# Patient Record
Sex: Male | Born: 1965
Health system: Southern US, Academic
[De-identification: ages and names within clinical notes are randomized; demographics above are authoritative.]

## PROBLEM LIST (undated history)

## (undated) ENCOUNTER — Encounter: Attending: Adult Health | Primary: Adult Health

## (undated) ENCOUNTER — Ambulatory Visit: Payer: PRIVATE HEALTH INSURANCE

## (undated) ENCOUNTER — Encounter

## (undated) ENCOUNTER — Ambulatory Visit: Payer: Medicare (Managed Care) | Attending: Adult Health | Primary: Adult Health

## (undated) ENCOUNTER — Telehealth

## (undated) ENCOUNTER — Telehealth: Attending: Urology | Primary: Urology

## (undated) ENCOUNTER — Ambulatory Visit

## (undated) ENCOUNTER — Encounter: Payer: PRIVATE HEALTH INSURANCE | Attending: Gastroenterology | Primary: Gastroenterology

## (undated) ENCOUNTER — Encounter: Attending: Family Medicine | Primary: Family Medicine

## (undated) ENCOUNTER — Encounter: Attending: Physician Assistant | Primary: Physician Assistant

## (undated) ENCOUNTER — Encounter
Payer: PRIVATE HEALTH INSURANCE | Attending: Student in an Organized Health Care Education/Training Program | Primary: Student in an Organized Health Care Education/Training Program

## (undated) ENCOUNTER — Ambulatory Visit: Payer: PRIVATE HEALTH INSURANCE | Attending: Adult Health | Primary: Adult Health

## (undated) ENCOUNTER — Inpatient Hospital Stay: Payer: Medicare (Managed Care)

## (undated) ENCOUNTER — Encounter: Payer: PRIVATE HEALTH INSURANCE | Attending: Family Medicine | Primary: Family Medicine

## (undated) ENCOUNTER — Encounter: Payer: PRIVATE HEALTH INSURANCE | Attending: Adult Health | Primary: Adult Health

## (undated) ENCOUNTER — Telehealth: Attending: Pulmonary Disease | Primary: Pulmonary Disease

## (undated) ENCOUNTER — Encounter: Payer: PRIVATE HEALTH INSURANCE | Attending: Urology | Primary: Urology

## (undated) ENCOUNTER — Ambulatory Visit: Payer: MEDICARE

## (undated) ENCOUNTER — Encounter: Payer: PRIVATE HEALTH INSURANCE | Attending: Mental Health | Primary: Mental Health

## (undated) ENCOUNTER — Encounter: Attending: Pulmonary Disease | Primary: Pulmonary Disease

## (undated) ENCOUNTER — Ambulatory Visit: Payer: PRIVATE HEALTH INSURANCE | Attending: Urology | Primary: Urology

## (undated) ENCOUNTER — Encounter: Attending: Internal Medicine | Primary: Internal Medicine

## (undated) ENCOUNTER — Ambulatory Visit: Payer: Medicare (Managed Care) | Attending: Neurology | Primary: Neurology

## (undated) ENCOUNTER — Ambulatory Visit: Payer: PRIVATE HEALTH INSURANCE | Attending: Family Medicine | Primary: Family Medicine

## (undated) DIAGNOSIS — J449 Chronic obstructive pulmonary disease, unspecified: Secondary | ICD-10-CM

## (undated) DIAGNOSIS — F988 Other specified behavioral and emotional disorders with onset usually occurring in childhood and adolescence: Secondary | ICD-10-CM

## (undated) DIAGNOSIS — F1120 Opioid dependence, uncomplicated: Secondary | ICD-10-CM

## (undated) DIAGNOSIS — G43909 Migraine, unspecified, not intractable, without status migrainosus: Secondary | ICD-10-CM

## (undated) HISTORY — PX: CORONARY ANGIOPLASTY WITH STENT PLACEMENT: SHX49

## (undated) HISTORY — PX: NO PAST SURGERIES: SHX2092

---
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
## 2010-01-23 ENCOUNTER — Emergency Department: Payer: Self-pay | Admitting: Unknown Physician Specialty

## 2010-06-19 ENCOUNTER — Emergency Department: Payer: Self-pay | Admitting: Unknown Physician Specialty

## 2013-01-11 ENCOUNTER — Encounter: Payer: Self-pay | Admitting: Physician Assistant

## 2013-02-06 ENCOUNTER — Encounter: Payer: Self-pay | Admitting: Physician Assistant

## 2013-05-27 ENCOUNTER — Ambulatory Visit: Payer: Self-pay

## 2013-06-27 ENCOUNTER — Ambulatory Visit: Payer: Self-pay

## 2013-08-29 ENCOUNTER — Emergency Department: Payer: Self-pay | Admitting: Emergency Medicine

## 2013-08-29 LAB — BASIC METABOLIC PANEL
Anion Gap: 5 — ABNORMAL LOW (ref 7–16)
BUN: 10 mg/dL (ref 7–18)
CALCIUM: 8.6 mg/dL (ref 8.5–10.1)
CREATININE: 0.91 mg/dL (ref 0.60–1.30)
Chloride: 106 mmol/L (ref 98–107)
Co2: 27 mmol/L (ref 21–32)
EGFR (African American): >60
EGFR (Non-African Amer.): >60
Glucose: 103 mg/dL — ABNORMAL HIGH (ref 65–99)
Osmolality: 275 (ref 275–301)
POTASSIUM: 4 mmol/L (ref 3.5–5.1)
Sodium: 138 mmol/L (ref 136–145)

## 2013-08-29 LAB — URINALYSIS, COMPLETE
BILIRUBIN, UR: NEGATIVE
Bacteria: NONE SEEN
Glucose,UR: NEGATIVE mg/dL (ref 0–75)
KETONE: NEGATIVE
Leukocyte Esterase: NEGATIVE
NITRITE: NEGATIVE
PROTEIN: NEGATIVE
Ph: 5 (ref 4.5–8.0)
Specific Gravity: 1.021 (ref 1.003–1.030)
Squamous Epithelial: <1

## 2013-08-29 LAB — CBC
HCT: 43.2 % (ref 40.0–52.0)
HGB: 14.3 g/dL (ref 13.0–18.0)
MCH: 29.3 pg (ref 26.0–34.0)
MCHC: 33.2 g/dL (ref 32.0–36.0)
MCV: 88 fL (ref 80–100)
PLATELETS: 267 10*3/uL (ref 150–440)
RBC: 4.89 10*6/uL (ref 4.40–5.90)
RDW: 13.8 % (ref 11.5–14.5)
WBC: 13.2 10*3/uL — AB (ref 3.8–10.6)

## 2013-08-29 LAB — GC/CHLAMYDIA PROBE AMP

## 2013-09-28 ENCOUNTER — Ambulatory Visit: Payer: Self-pay | Admitting: Emergency Medicine

## 2013-11-21 ENCOUNTER — Ambulatory Visit: Payer: Self-pay | Admitting: Family Medicine

## 2013-11-21 LAB — CBC WITH DIFFERENTIAL/PLATELET
BASOS PCT: 0.9 %
Basophil #: 0.1 10*3/uL (ref 0.0–0.1)
Eosinophil #: 0.6 10*3/uL (ref 0.0–0.7)
Eosinophil %: 5 %
HCT: 42.9 % (ref 40.0–52.0)
HGB: 14.2 g/dL (ref 13.0–18.0)
LYMPHS ABS: 4 10*3/uL — AB (ref 1.0–3.6)
Lymphocyte %: 33.4 %
MCH: 29.1 pg (ref 26.0–34.0)
MCHC: 33 g/dL (ref 32.0–36.0)
MCV: 88 fL (ref 80–100)
Monocyte #: 0.8 x10 3/mm (ref 0.2–1.0)
Monocyte %: 6.9 %
NEUTROS ABS: 6.5 10*3/uL (ref 1.4–6.5)
Neutrophil %: 53.8 %
Platelet: 249 10*3/uL (ref 150–440)
RBC: 4.87 10*6/uL (ref 4.40–5.90)
RDW: 13.1 % (ref 11.5–14.5)
WBC: 12 10*3/uL — ABNORMAL HIGH (ref 3.8–10.6)

## 2013-11-21 LAB — COMPREHENSIVE METABOLIC PANEL
ALK PHOS: 81 U/L
ALT: 29 U/L (ref 12–78)
ANION GAP: 5 — AB (ref 7–16)
AST: 17 U/L (ref 15–37)
Albumin: 4.1 g/dL (ref 3.4–5.0)
BUN: 16 mg/dL (ref 7–18)
Bilirubin,Total: 0.3 mg/dL (ref 0.2–1.0)
Calcium, Total: 8.8 mg/dL (ref 8.5–10.1)
Chloride: 100 mmol/L (ref 98–107)
Co2: 32 mmol/L (ref 21–32)
Creatinine: 0.93 mg/dL (ref 0.60–1.30)
EGFR (African American): >60
GLUCOSE: 94 mg/dL (ref 65–99)
Osmolality: 275 (ref 275–301)
Potassium: 3.6 mmol/L (ref 3.5–5.1)
Sodium: 137 mmol/L (ref 136–145)
Total Protein: 7.6 g/dL (ref 6.4–8.2)

## 2013-11-21 LAB — URINALYSIS, COMPLETE
Bacteria: NEGATIVE
Bilirubin,UR: NEGATIVE
Glucose,UR: NEGATIVE mg/dL (ref 0–75)
KETONE: NEGATIVE
Leukocyte Esterase: NEGATIVE
Nitrite: NEGATIVE
PH: 7.5 (ref 4.5–8.0)
PROTEIN: NEGATIVE
SPECIFIC GRAVITY: 1.005 (ref 1.003–1.030)
SQUAMOUS EPITHELIAL: NONE SEEN
WBC UR: NONE SEEN /HPF (ref 0–5)

## 2013-11-28 ENCOUNTER — Ambulatory Visit: Payer: Self-pay | Admitting: Internal Medicine

## 2013-11-28 LAB — COMPREHENSIVE METABOLIC PANEL
ALK PHOS: 74 U/L
ALT: 33 U/L (ref 12–78)
Albumin: 3.9 g/dL (ref 3.4–5.0)
Anion Gap: 9 (ref 7–16)
BUN: 10 mg/dL (ref 7–18)
Bilirubin,Total: 0.3 mg/dL (ref 0.2–1.0)
Calcium, Total: 9.1 mg/dL (ref 8.5–10.1)
Chloride: 102 mmol/L (ref 98–107)
Co2: 28 mmol/L (ref 21–32)
Creatinine: 0.84 mg/dL (ref 0.60–1.30)
EGFR (African American): >60
EGFR (Non-African Amer.): >60
Glucose: 115 mg/dL — ABNORMAL HIGH (ref 65–99)
OSMOLALITY: 278 (ref 275–301)
Potassium: 4.5 mmol/L (ref 3.5–5.1)
SGOT(AST): 18 U/L (ref 15–37)
Sodium: 139 mmol/L (ref 136–145)
TOTAL PROTEIN: 7.2 g/dL (ref 6.4–8.2)

## 2013-11-28 LAB — URINALYSIS, COMPLETE
BILIRUBIN, UR: NEGATIVE
Bacteria: NEGATIVE
Glucose,UR: NEGATIVE mg/dL (ref 0–75)
KETONE: NEGATIVE
LEUKOCYTE ESTERASE: NEGATIVE
NITRITE: NEGATIVE
PH: 6 (ref 4.5–8.0)
Protein: NEGATIVE
SPECIFIC GRAVITY: 1.025 (ref 1.003–1.030)
Squamous Epithelial: NONE SEEN

## 2013-11-28 LAB — CBC WITH DIFFERENTIAL/PLATELET
Basophil #: 0.1 10*3/uL (ref 0.0–0.1)
Basophil %: 0.6 %
EOS ABS: 0.3 10*3/uL (ref 0.0–0.7)
Eosinophil %: 1.7 %
HCT: 43.7 % (ref 40.0–52.0)
HGB: 14.2 g/dL (ref 13.0–18.0)
LYMPHS PCT: 15.8 %
Lymphocyte #: 2.4 10*3/uL (ref 1.0–3.6)
MCH: 28.7 pg (ref 26.0–34.0)
MCHC: 32.4 g/dL (ref 32.0–36.0)
MCV: 89 fL (ref 80–100)
Monocyte #: 0.7 x10 3/mm (ref 0.2–1.0)
Monocyte %: 4.8 %
NEUTROS ABS: 11.5 10*3/uL — AB (ref 1.4–6.5)
NEUTROS PCT: 77.1 %
PLATELETS: 243 10*3/uL (ref 150–440)
RBC: 4.94 10*6/uL (ref 4.40–5.90)
RDW: 13.4 % (ref 11.5–14.5)
WBC: 15 10*3/uL — ABNORMAL HIGH (ref 3.8–10.6)

## 2013-11-28 LAB — DRUG SCREEN, URINE
Amphetamines, Ur Screen: NEGATIVE (ref ?–1000)
Barbiturates, Ur Screen: NEGATIVE (ref ?–200)
Benzodiazepine, Ur Scrn: NEGATIVE (ref ?–200)
CANNABINOID 50 NG, UR ~~LOC~~: POSITIVE (ref ?–50)
COCAINE METABOLITE, UR ~~LOC~~: NEGATIVE (ref ?–300)
MDMA (Ecstasy)Ur Screen: NEGATIVE (ref ?–500)
Methadone, Ur Screen: NEGATIVE (ref ?–300)
Opiate, Ur Screen: NEGATIVE (ref ?–300)
Phencyclidine (PCP) Ur S: NEGATIVE (ref ?–25)
Tricyclic, Ur Screen: NEGATIVE (ref ?–1000)

## 2013-11-28 LAB — LIPASE, BLOOD: Lipase: 94 U/L (ref 73–393)

## 2013-11-28 LAB — AMYLASE: AMYLASE: 25 U/L (ref 25–115)

## 2013-11-29 LAB — URINE CULTURE

## 2014-01-04 ENCOUNTER — Ambulatory Visit: Payer: Self-pay

## 2014-01-04 LAB — COMPREHENSIVE METABOLIC PANEL
ALT: 29 U/L
AST: 21 U/L (ref 15–37)
Albumin: 4 g/dL (ref 3.4–5.0)
Alkaline Phosphatase: 72 U/L
Anion Gap: 5 — ABNORMAL LOW (ref 7–16)
BILIRUBIN TOTAL: 0.5 mg/dL (ref 0.2–1.0)
BUN: 19 mg/dL — ABNORMAL HIGH (ref 7–18)
CHLORIDE: 102 mmol/L (ref 98–107)
CO2: 29 mmol/L (ref 21–32)
CREATININE: 1.14 mg/dL (ref 0.60–1.30)
Calcium, Total: 8.9 mg/dL (ref 8.5–10.1)
EGFR (African American): >60
EGFR (Non-African Amer.): >60
GLUCOSE: 102 mg/dL — AB (ref 65–99)
Osmolality: 274 (ref 275–301)
Potassium: 4.6 mmol/L (ref 3.5–5.1)
Sodium: 136 mmol/L (ref 136–145)
TOTAL PROTEIN: 7.3 g/dL (ref 6.4–8.2)

## 2014-01-04 LAB — URINALYSIS, COMPLETE
Bacteria: NEGATIVE
Bilirubin,UR: NEGATIVE
Glucose,UR: NEGATIVE mg/dL (ref 0–75)
Ketone: NEGATIVE
Leukocyte Esterase: NEGATIVE
Nitrite: NEGATIVE
Ph: 6 (ref 4.5–8.0)
Protein: NEGATIVE
Specific Gravity: 1.02 (ref 1.003–1.030)
Squamous Epithelial: NONE SEEN

## 2014-01-04 LAB — CBC WITH DIFFERENTIAL/PLATELET
Basophil #: 0.1 10*3/uL (ref 0.0–0.1)
Basophil %: 0.8 %
Eosinophil #: 0.4 10*3/uL (ref 0.0–0.7)
Eosinophil %: 2.9 %
HCT: 43.5 % (ref 40.0–52.0)
HGB: 14.4 g/dL (ref 13.0–18.0)
LYMPHS ABS: 3.5 10*3/uL (ref 1.0–3.6)
Lymphocyte %: 28.4 %
MCH: 28.9 pg (ref 26.0–34.0)
MCHC: 33 g/dL (ref 32.0–36.0)
MCV: 88 fL (ref 80–100)
MONO ABS: 0.6 x10 3/mm (ref 0.2–1.0)
Monocyte %: 4.7 %
Neutrophil #: 7.9 10*3/uL — ABNORMAL HIGH (ref 1.4–6.5)
Neutrophil %: 63.2 %
Platelet: 270 10*3/uL (ref 150–440)
RBC: 4.96 10*6/uL (ref 4.40–5.90)
RDW: 13.1 % (ref 11.5–14.5)
WBC: 12.5 10*3/uL — ABNORMAL HIGH (ref 3.8–10.6)

## 2014-01-04 LAB — SEDIMENTATION RATE: ERYTHROCYTE SED RATE: 5 mm/h (ref 0–15)

## 2014-01-04 LAB — LIPASE, BLOOD: LIPASE: 186 U/L (ref 73–393)

## 2014-01-04 LAB — AMYLASE: Amylase: 37 U/L (ref 25–115)

## 2014-01-05 ENCOUNTER — Ambulatory Visit: Payer: Self-pay

## 2014-01-05 LAB — CLOSTRIDIUM DIFFICILE(ARMC)

## 2014-01-08 LAB — STOOL CULTURE

## 2014-01-11 ENCOUNTER — Ambulatory Visit: Payer: Self-pay | Admitting: Family Medicine

## 2014-05-04 ENCOUNTER — Ambulatory Visit: Payer: Self-pay | Admitting: Physician Assistant

## 2014-06-11 LAB — BASIC METABOLIC PANEL
Creatinine: 1 mg/dL (ref <=1.3)
Glucose: 82 mg/dL

## 2014-10-02 ENCOUNTER — Ambulatory Visit
Admit: 2014-10-02 | Disposition: A | Discharge status: Home / Self Care | Payer: Self-pay | Attending: Physician Assistant | Admitting: Physician Assistant

## 2014-10-02 ENCOUNTER — Ambulatory Visit
Admit: 2014-10-02 | Disposition: A | Discharge status: Home / Self Care | Payer: Self-pay | Attending: Family Medicine | Admitting: Family Medicine

## 2014-10-02 LAB — CBC WITH DIFFERENTIAL/PLATELET
BASOS PCT: 1.5 %
Basophil #: 0.2 10*3/uL — ABNORMAL HIGH (ref 0.0–0.1)
EOS PCT: 1 %
Eosinophil #: 0.1 10*3/uL (ref 0.0–0.7)
HCT: 40.9 % (ref 40.0–52.0)
HGB: 13.8 g/dL (ref 13.0–18.0)
Lymphocyte #: 3.3 10*3/uL (ref 1.0–3.6)
Lymphocyte %: 26.9 %
MCH: 29.2 pg (ref 26.0–34.0)
MCHC: 33.8 g/dL (ref 32.0–36.0)
MCV: 86 fL (ref 80–100)
MONO ABS: 0.6 x10 3/mm (ref 0.2–1.0)
Monocyte %: 4.6 %
NEUTROS ABS: 8.1 10*3/uL — AB (ref 1.4–6.5)
NEUTROS PCT: 66 %
Platelet: 256 10*3/uL (ref 150–440)
RBC: 4.73 10*6/uL (ref 4.40–5.90)
RDW: 13.2 % (ref 11.5–14.5)
WBC: 12.2 10*3/uL — ABNORMAL HIGH (ref 3.8–10.6)

## 2014-10-02 LAB — COMPREHENSIVE METABOLIC PANEL
ALBUMIN: 4.3 g/dL
ALK PHOS: 70 U/L
Anion Gap: 7 (ref 7–16)
BILIRUBIN TOTAL: 0.4 mg/dL
BUN: 14 mg/dL
CALCIUM: 9.2 mg/dL
CREATININE: 0.8 mg/dL
Chloride: 104 mmol/L
Co2: 26 mmol/L
GLUCOSE: 98 mg/dL
Potassium: 4.6 mmol/L
SGOT(AST): 17 U/L
SGPT (ALT): 15 U/L — ABNORMAL LOW
SODIUM: 137 mmol/L
TOTAL PROTEIN: 7.5 g/dL

## 2014-10-02 LAB — URINALYSIS, COMPLETE
Bacteria: NEGATIVE
Bilirubin,UR: NEGATIVE
Glucose,UR: NEGATIVE
Ketone: NEGATIVE
Leukocyte Esterase: NEGATIVE
NITRITE: NEGATIVE
PH: 7 (ref 5.0–8.0)
PROTEIN: NEGATIVE
Specific Gravity: 1.02 (ref 1.000–1.030)
Squamous Epithelial: NONE SEEN

## 2014-10-02 LAB — LIPASE, BLOOD: Lipase: 61 U/L — ABNORMAL HIGH

## 2014-10-02 LAB — AMYLASE: Amylase: 44 U/L

## 2014-10-04 DIAGNOSIS — F17201 Nicotine dependence, unspecified, in remission: Secondary | ICD-10-CM | POA: Insufficient documentation

## 2014-10-04 DIAGNOSIS — Z87442 Personal history of urinary calculi: Secondary | ICD-10-CM | POA: Insufficient documentation

## 2014-10-04 DIAGNOSIS — J439 Emphysema, unspecified: Secondary | ICD-10-CM | POA: Insufficient documentation

## 2014-10-04 DIAGNOSIS — I10 Essential (primary) hypertension: Secondary | ICD-10-CM | POA: Insufficient documentation

## 2014-10-04 DIAGNOSIS — K529 Noninfective gastroenteritis and colitis, unspecified: Secondary | ICD-10-CM | POA: Insufficient documentation

## 2015-02-15 IMAGING — CT CT STONE STUDY
2 of 4 series · 16 of 46 positions shown, 18 images · non-contrast
Comparison: 08/29/2013

CLINICAL DATA: Bilateral flank pain for 3 days. History of kidney
stone.

EXAM:
CT ABDOMEN AND PELVIS WITHOUT CONTRAST
TECHNIQUE: Multidetector CT imaging of the abdomen and pelvis was performed
following the standard protocol without IV contrast.

[Series 2: stone standard full · axial · 0.76mm/px · z∈[-1099,-664]mm · 13 of 95 slices shown, 15 images]
[im 4/95  soft-tissue]
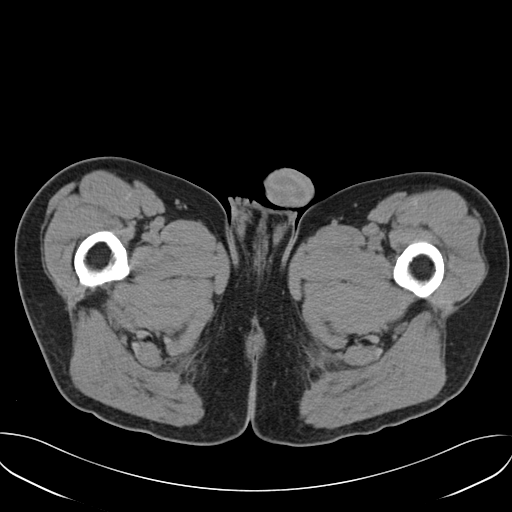
[im 4/95  bone]
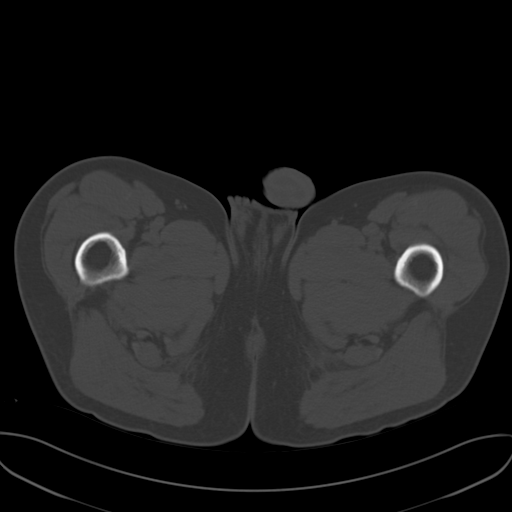
[im 12/95  soft-tissue]
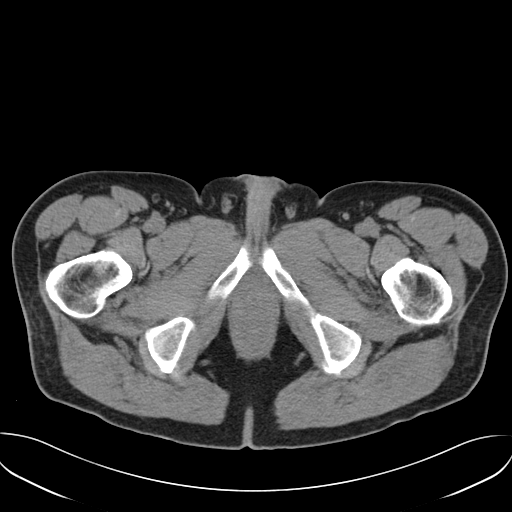
[im 19/95  soft-tissue]
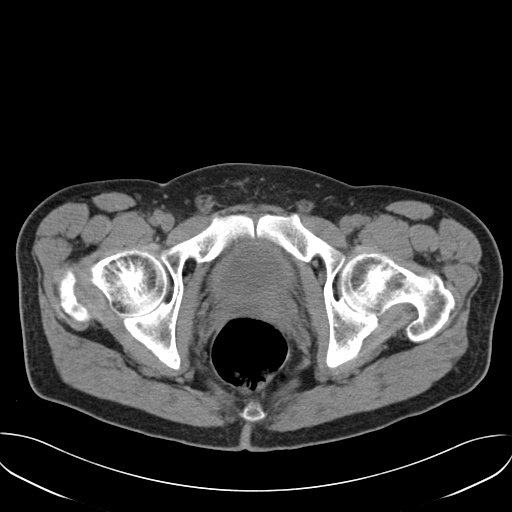
[im 27/95  soft-tissue]
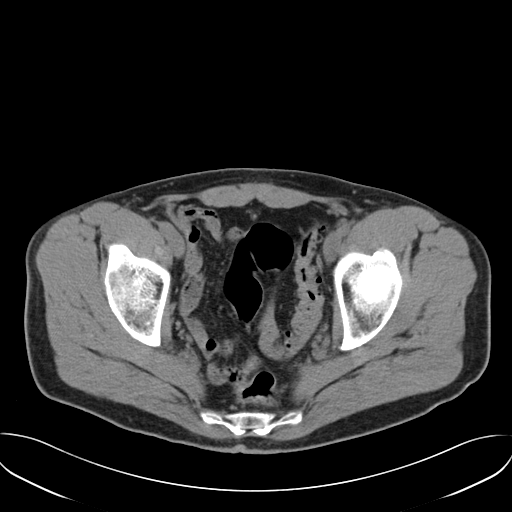
[im 34/95  soft-tissue]
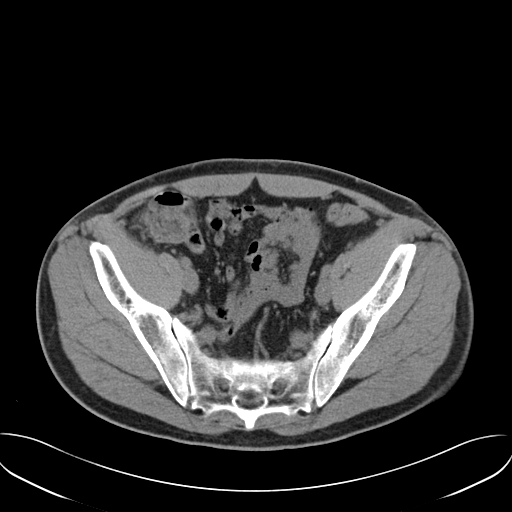
[im 42/95  soft-tissue]
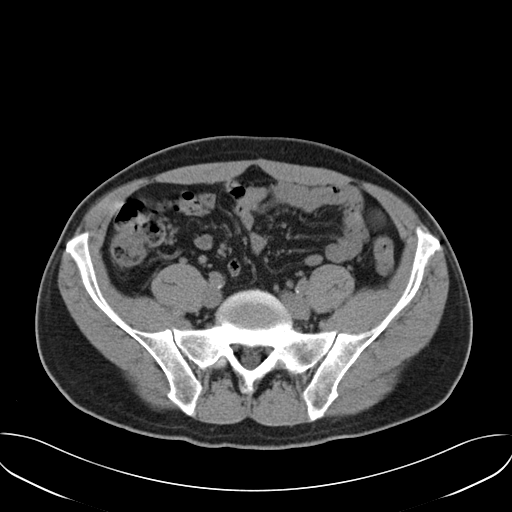
[im 49/95  soft-tissue]
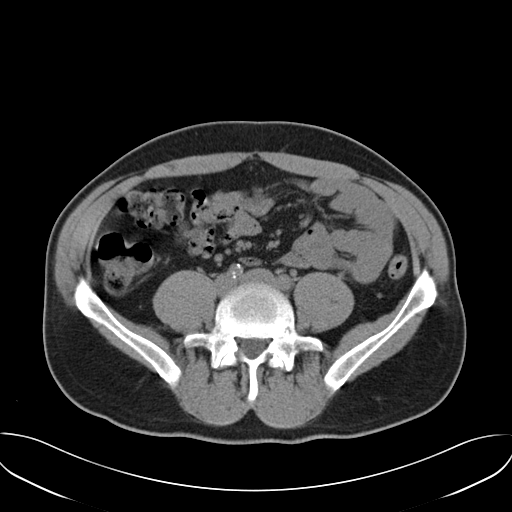
[im 53/95  soft-tissue]
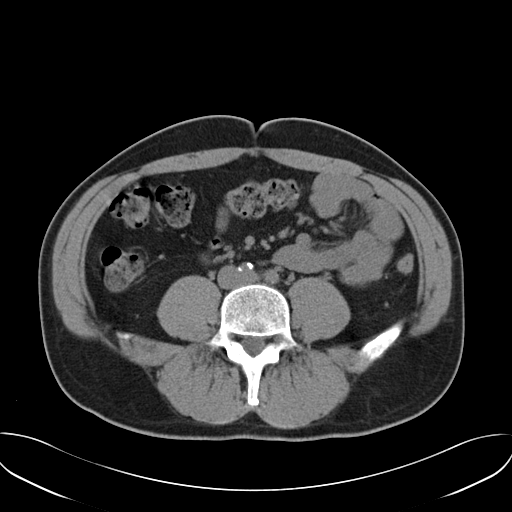
[im 61/95  soft-tissue]
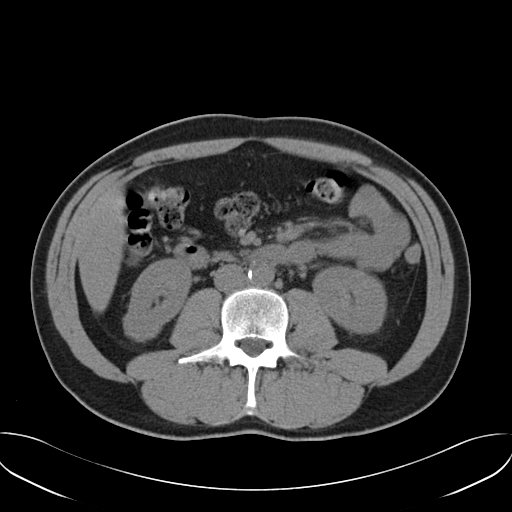
[im 61/95  bone]
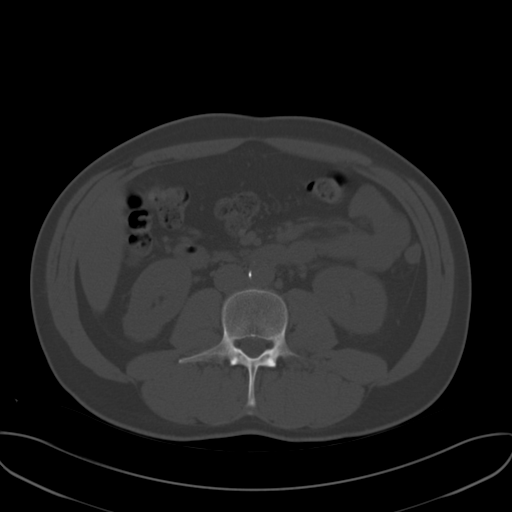
[im 68/95  soft-tissue]
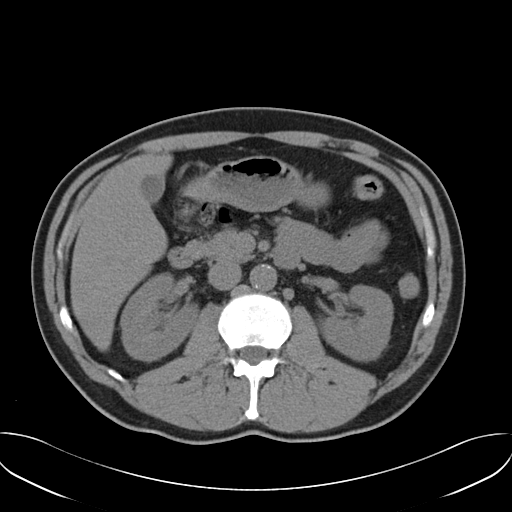
[im 76/95  soft-tissue]
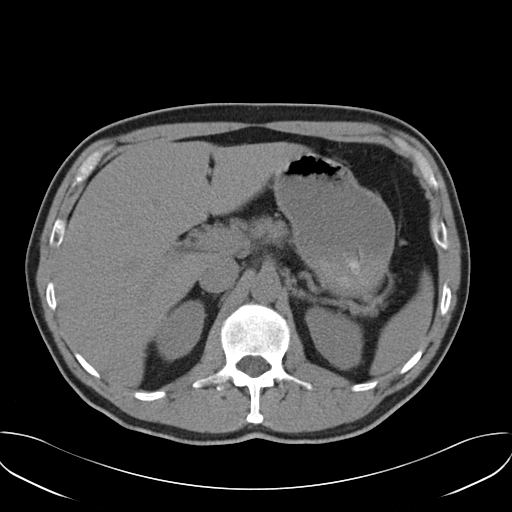
[im 83/95  soft-tissue]
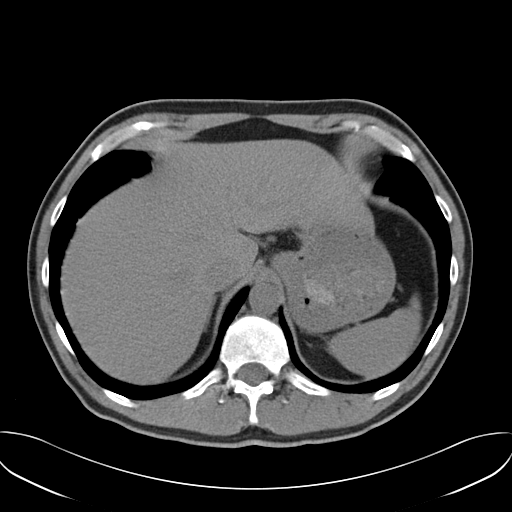
[im 91/95  soft-tissue]
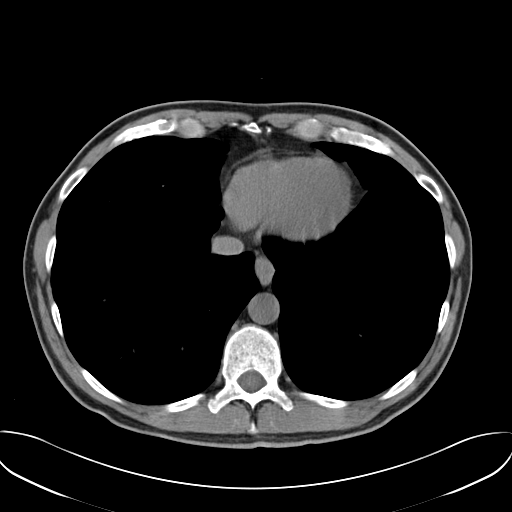

[Series 5: cor stone standard full · coronal · 0.69mm/px · 3 of 127 slices shown]
[im 43/127  soft-tissue]
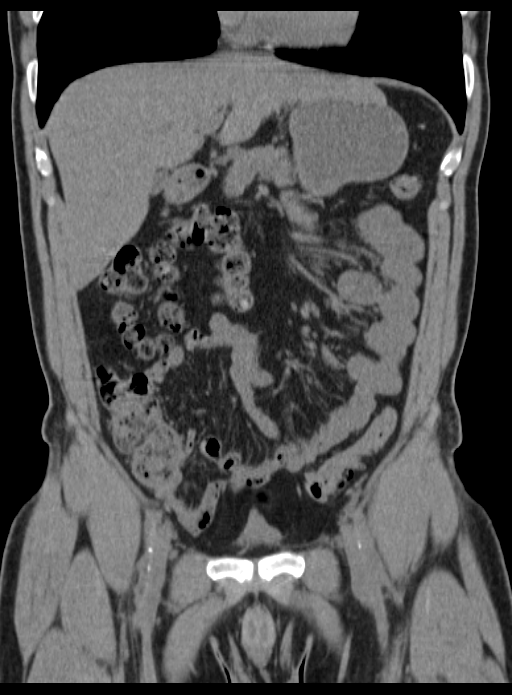
[im 57/127  soft-tissue]
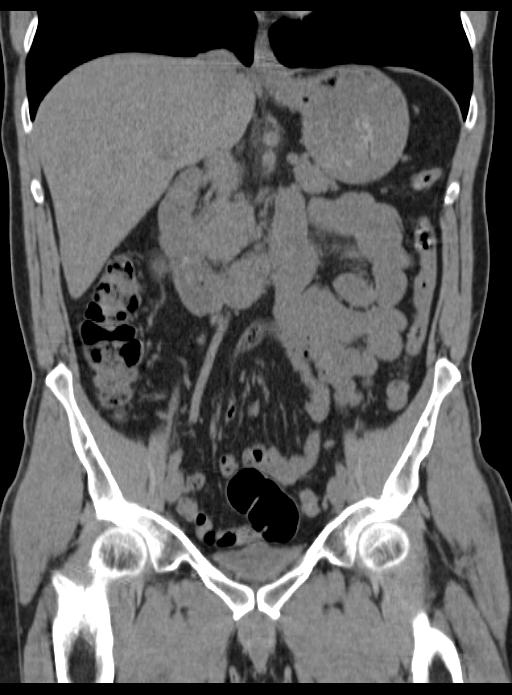
[im 71/127  soft-tissue]
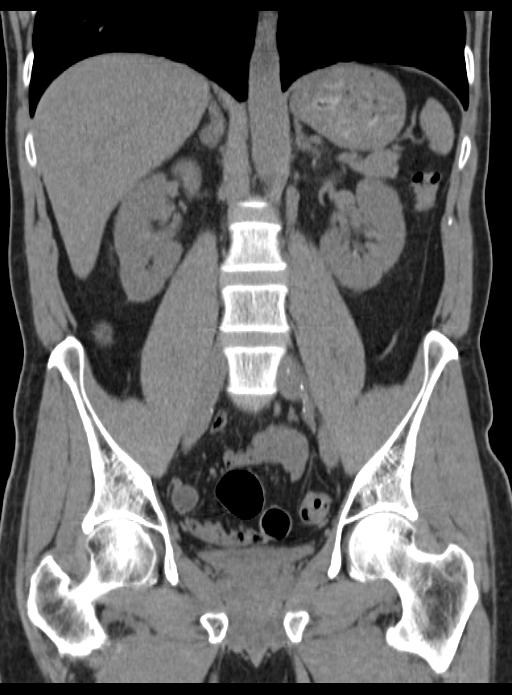

[16 of 46 positions shown; findings below may reference images not displayed]

FINDINGS: Lower Chest: Centrilobular emphysema at the bases. Normal heart size
without pericardial or pleural effusion.

Abdomen/Pelvis: Mild hepatic steatosis. Normal noncontrast
appearance of the spleen, stomach, pancreas, gallbladder, biliary
tract, adrenal glands.

Punctate left renal collecting system calculi, including a tiny
lower pole and a 2 mm upper pole stone. No hydronephrosis. No
right-sided urinary tract calculi. No hydroureter or ureteric
calculi.

Mild but age advanced aortic atherosclerosis. No retroperitoneal or
retrocrural adenopathy.

Scattered colonic diverticula. Normal terminal ileum. Normal small
bowel without abdominal ascites.

No pelvic adenopathy. Normal prostate. No bladder calculi. No
significant free fluid.

Bones/Musculoskeletal:  No acute osseous abnormality.
IMPRESSION: Left nephrolithiasis, without hydronephrosis or other acute process.

Mild hepatic steatosis.

Bibasilar emphysema with age advanced atherosclerosis.

## 2015-03-18 ENCOUNTER — Other Ambulatory Visit: Payer: Self-pay | Admitting: Family Medicine

## 2015-03-18 DIAGNOSIS — K589 Irritable bowel syndrome without diarrhea: Secondary | ICD-10-CM

## 2015-12-10 ENCOUNTER — Ambulatory Visit
Admission: EM | Admit: 2015-12-10 | Discharge: 2015-12-10 | Disposition: A | Discharge status: Other Acute Inpatient Hospital | Payer: BLUE CROSS/BLUE SHIELD | Attending: Family Medicine | Admitting: Family Medicine

## 2015-12-10 DIAGNOSIS — R1013 Epigastric pain: Secondary | ICD-10-CM

## 2015-12-10 HISTORY — DX: Opioid dependence, uncomplicated: F11.20

## 2015-12-10 HISTORY — DX: Migraine, unspecified, not intractable, without status migrainosus: G43.909

## 2015-12-10 HISTORY — DX: Other specified behavioral and emotional disorders with onset usually occurring in childhood and adolescence: F98.8

## 2015-12-10 HISTORY — DX: Chronic obstructive pulmonary disease, unspecified: J44.9

## 2015-12-10 LAB — CBC WITH DIFFERENTIAL/PLATELET
Basophils Absolute: 0.2 10*3/uL — ABNORMAL HIGH (ref 0–0.1)
Basophils Relative: 1 %
Eosinophils Absolute: 0.2 10*3/uL (ref 0–0.7)
HCT: 42.2 % (ref 40.0–52.0)
Hemoglobin: 13.9 g/dL (ref 13.0–18.0)
LYMPHS ABS: 3 10*3/uL (ref 1.0–3.6)
Lymphocytes Relative: 21 %
MCH: 28.8 pg (ref 26.0–34.0)
MCHC: 33 g/dL (ref 32.0–36.0)
MCV: 87.2 fL (ref 80.0–100.0)
Monocytes Absolute: 0.8 10*3/uL (ref 0.2–1.0)
Neutro Abs: 10.6 10*3/uL — ABNORMAL HIGH (ref 1.4–6.5)
Neutrophils Relative %: 71 %
Platelets: 331 10*3/uL (ref 150–440)
RBC: 4.83 MIL/uL (ref 4.40–5.90)
RDW: 13 % (ref 11.5–14.5)
WBC: 14.7 10*3/uL — AB (ref 3.8–10.6)

## 2015-12-10 LAB — COMPREHENSIVE METABOLIC PANEL
ALT: 15 U/L — ABNORMAL LOW (ref 17–63)
ANION GAP: 7 (ref 5–15)
AST: 17 U/L (ref 15–41)
Albumin: 4.2 g/dL (ref 3.5–5.0)
Alkaline Phosphatase: 81 U/L (ref 38–126)
BUN: 17 mg/dL (ref 6–20)
CHLORIDE: 101 mmol/L (ref 101–111)
CO2: 25 mmol/L (ref 22–32)
Calcium: 9.3 mg/dL (ref 8.9–10.3)
Creatinine, Ser: 0.92 mg/dL (ref 0.61–1.24)
GFR calc non Af Amer: >60 mL/min (ref >60)
GLUCOSE: 156 mg/dL — AB (ref 65–99)
Potassium: 4.1 mmol/L (ref 3.5–5.1)
Sodium: 133 mmol/L — ABNORMAL LOW (ref 135–145)
Total Bilirubin: 0.5 mg/dL (ref 0.3–1.2)
Total Protein: 7.5 g/dL (ref 6.5–8.1)

## 2015-12-10 LAB — URINALYSIS COMPLETE WITH MICROSCOPIC (ARMC ONLY)
Glucose, UA: NEGATIVE mg/dL
HGB URINE DIPSTICK: NEGATIVE
KETONES UR: NEGATIVE mg/dL
LEUKOCYTES UA: NEGATIVE
NITRITE: NEGATIVE
PH: 6 (ref 5.0–8.0)
PROTEIN: 30 mg/dL — AB
RBC / HPF: NONE SEEN RBC/hpf (ref 0–5)
SPECIFIC GRAVITY, URINE: 1.025 (ref 1.005–1.030)

## 2015-12-10 LAB — AMYLASE: Amylase: 49 U/L (ref 28–100)

## 2015-12-10 LAB — LIPASE, BLOOD: Lipase: 73 U/L — ABNORMAL HIGH (ref 11–51)

## 2015-12-10 NOTE — ED Notes (Signed)
Patient states that he has abdominal pain. Patient states that he has this often and has been seen here before for this. Patient states that he has nausea and dry heaves today with weakness and fatigue. Patient states that he has seen a GI doctor in the past and they have been unable to tell what is causing his symptoms.

## 2015-12-10 NOTE — ED Provider Notes (Signed)
CSN: [id number]     Arrival date & time 12/10/15  [id number] History   First MD Initiated Contact with Patient 12/10/15 [phone]     Chief Complaint  Patient presents with  . Abdominal Pain   (Consider location/radiation/quality/duration/timing/severity/associated sxs/prior Treatment) HPI: Patient presents today with symptoms of abdominal pain. Patient states that he has chronic abdominal pain for years. He denies his pain being any worse today. He denies any chest pain or shortness of breath. Patient states that he often has nausea and dry heaves when he gets the abdominal pain. He has had generalized fatigue. He does admit to weight loss but is unsure how much. He has been seen by a GI doctor and has had endoscopies and colonoscopies. The last CT scan in Epic was from 2015. He states he has been diagnosed with pancreatitis in the past and Crohn's disease. He denies any vomiting or diarrhea or constipation today. He has not eaten today. He is on Suboxone for opiate addiction. He denies alcohol use. He does admit to occasional marijuana use and cigarette smoking. He denies any other illicit drug use. He is in no distress in the office today. He denies eating or drinking causing worsening of his symptoms. He states that he has been looking online and wonders whether he is diabetic. He states that he has an appointment with his GI doctor tomorrow.  Past Medical History  Diagnosis Date  . Migraines   . Opiate addiction (HCC)   . ADD (attention deficit disorder)   . COPD (chronic obstructive pulmonary disease) [facility](HCC)    Past Surgical History  Procedure Laterality Date  . No past surgeries     History reviewed. No pertinent family history. Social History  Substance Use Topics  . Smoking status: Current Every Day Smoker -- 1.00 packs/day    Types: Cigarettes  . Smokeless tobacco: None  . Alcohol Use: No    Review of Systems: Negative except mentioned above.   Allergies  Gemfibrozil  Home Medications    Prior to Admission medications   Medication Sig Start Date End Date Taking? Authorizing Provider  albuterol (PROVENTIL HFA;VENTOLIN HFA) 108 (90 BASE) MCG/ACT inhaler Inhale 2 puffs into the lungs 4 (four) times daily. 06/11/14  Yes Historical Provider, MD  amphetamine-dextroamphetamine (ADDERALL XR) 30 MG 24 hr capsule Take 60 mg by mouth daily.   Yes Historical Provider, MD  amphetamine-dextroamphetamine (ADDERALL) 20 MG tablet Take 20 mg by mouth daily.   Yes Historical Provider, MD  budesonide-formoterol (SYMBICORT) 160-4.5 MCG/ACT inhaler Inhale 2 puffs into the lungs 2 (two) times daily.   Yes Historical Provider, MD  buprenorphine-naloxone (SUBOXONE) 8-2 MG SUBL SL tablet Place 2.5 tablets under the tongue daily.   Yes Historical Provider, MD  hyoscyamine (LEVSIN SL) 0.125 MG SL tablet INSERT 1 TABLET UNDER THE TONGUE 4 TIMESA DAY 03/18/15  Yes Duanne Limerickeanna C Jones, MD  lisinopril (PRINIVIL,ZESTRIL) 10 MG tablet Take 1 tablet by mouth daily. 06/11/14  Yes Historical Provider, MD  omeprazole (PRILOSEC) 40 MG capsule Take 1 capsule by mouth daily. 06/11/14  Yes Historical Provider, MD  ondansetron (ZOFRAN) 4 MG tablet Take 4 mg by mouth every 8 (eight) hours as needed for nausea or vomiting.   Yes Historical Provider, MD   Meds Ordered and Administered this Visit  Medications - No data to display  BP 123/81 mmHg  Pulse 92  Temp(Src) 97.6 F (36.4 C) (Tympanic)  Resp 16  Ht 6' 2.75" (1.899 m)  Wt 163 lb (73.936 kg)  BMI 20.50 kg/m2  SpO2 99% No data found.   Physical Exam   GENERAL: NAD HEENT: no pharyngeal erythema, no exudate, no erythema of TMs, no cervical LAD RESP: CTA B CARD: RRR ABD: soft, mild epigastric tenderness, no rebound or guarding, no flank tenderness  NEURO: CN II-XII grossly intact   ED Course  Procedures (including critical care time)  Labs Review Labs Reviewed  CBC WITH DIFFERENTIAL/PLATELET  AMYLASE  LIPASE, BLOOD  URINALYSIS COMPLETEWITH MICROSCOPIC  ([facility] ONLY)  COMPREHENSIVE METABOLIC PANEL    Imaging Review No results found.    MDM  A/P: Epigastric abdominal pain, elevated lipase- patient does have an elevated white blood cell count and slightly elevated fasting glucose, discussed possibility of pancreatitis, would recommend that patient go to the ER at this time for further evaluation with CT scan. I do recommend that patient follow up with his GI doctor tomorrow as well.     Jolene ProvostKirtida Aeon Kessner, MD 12/10/15 [phone]

## 2016-12-15 ENCOUNTER — Emergency Department
Admission: EM | Admit: 2016-12-15 | Discharge: 2016-12-16 | Disposition: A | Discharge status: Home / Self Care | Payer: BC Managed Care – PPO | Source: Intra-hospital

## 2016-12-15 ENCOUNTER — Emergency Department
Admission: EM | Admit: 2016-12-15 | Discharge: 2016-12-16 | Disposition: A | Discharge status: Home / Self Care | Payer: BC Managed Care – PPO | Source: Intra-hospital | Attending: Emergency Medicine

## 2016-12-15 DIAGNOSIS — R1031 Right lower quadrant pain: Principal | ICD-10-CM

## 2016-12-15 MED ORDER — TAMSULOSIN 0.4 MG CAPSULE
ORAL_CAPSULE | Freq: Every day | ORAL | 0 refills | 0 days | Status: CP
Start: 2016-12-15 — End: 2016-12-25

## 2016-12-16 MED ORDER — PROMETHAZINE 25 MG TABLET
ORAL_TABLET | Freq: Four times a day (QID) | ORAL | 0 refills | 0.00000 days | Status: CP | PRN
Start: 2016-12-16 — End: 2016-12-23

## 2016-12-16 MED ORDER — CIPROFLOXACIN 500 MG TABLET
ORAL_TABLET | Freq: Two times a day (BID) | ORAL | 0 refills | 0.00000 days | Status: CP
Start: 2016-12-16 — End: 2016-12-23

## 2016-12-16 MED ORDER — IBUPROFEN 600 MG TABLET
ORAL_TABLET | Freq: Four times a day (QID) | ORAL | 0 refills | 0.00000 days | Status: CP | PRN
Start: 2016-12-16 — End: 2017-01-19

## 2017-01-19 ENCOUNTER — Emergency Department
Admission: EM | Admit: 2017-01-19 | Discharge: 2017-01-19 | Disposition: A | Discharge status: Home / Self Care | Payer: BC Managed Care – PPO | Source: Intra-hospital | Attending: Registered Nurse | Admitting: Registered Nurse

## 2017-01-19 ENCOUNTER — Emergency Department
Admission: EM | Admit: 2017-01-19 | Discharge: 2017-01-19 | Disposition: A | Discharge status: Home / Self Care | Payer: BC Managed Care – PPO | Source: Intra-hospital

## 2017-01-19 DIAGNOSIS — R109 Unspecified abdominal pain: Principal | ICD-10-CM

## 2017-01-19 MED ORDER — TAMSULOSIN 0.4 MG CAPSULE
ORAL_CAPSULE | Freq: Every day | ORAL | 0 refills | 0 days | Status: CP
Start: 2017-01-19 — End: 2017-01-27

## 2017-01-19 MED ORDER — KETOROLAC 10 MG TABLET
ORAL_TABLET | Freq: Four times a day (QID) | ORAL | 0 refills | 0 days | Status: CP | PRN
Start: 2017-01-19 — End: 2017-01-24

## 2017-01-19 MED ORDER — ONDANSETRON 4 MG DISINTEGRATING TABLET
ORAL_TABLET | Freq: Three times a day (TID) | ORAL | 0 refills | 0.00000 days | Status: CP | PRN
Start: 2017-01-19 — End: 2017-01-26

## 2017-01-25 ENCOUNTER — Ambulatory Visit
Admission: RE | Admit: 2017-01-25 | Discharge: 2017-01-25 | Payer: BC Managed Care – PPO | Attending: Urology | Admitting: Urology

## 2017-01-25 DIAGNOSIS — Z87442 Personal history of urinary calculi: Principal | ICD-10-CM

## 2017-02-15 MED ORDER — OMEPRAZOLE 20 MG CAPSULE,DELAYED RELEASE
ORAL_CAPSULE | 1 refills | 0 days | Status: CP
Start: 2017-02-15 — End: 2017-11-16

## 2017-02-15 MED ORDER — LISINOPRIL 10 MG TABLET
ORAL_TABLET | 1 refills | 0 days | Status: CP
Start: 2017-02-15 — End: 2017-03-18

## 2017-02-24 ENCOUNTER — Ambulatory Visit
Admission: RE | Admit: 2017-02-24 | Discharge: 2017-02-24 | Payer: BC Managed Care – PPO | Attending: Family Medicine | Admitting: Family Medicine

## 2017-02-24 DIAGNOSIS — J441 Chronic obstructive pulmonary disease with (acute) exacerbation: Secondary | ICD-10-CM

## 2017-02-24 DIAGNOSIS — J449 Chronic obstructive pulmonary disease, unspecified: Principal | ICD-10-CM

## 2017-02-24 DIAGNOSIS — Z720 Tobacco use: Secondary | ICD-10-CM

## 2017-02-24 MED ORDER — BUDESONIDE-FORMOTEROL HFA 160 MCG-4.5 MCG/ACTUATION AEROSOL INHALER
Freq: Two times a day (BID) | RESPIRATORY_TRACT | 3 refills | 0.00000 days | Status: CP
Start: 2017-02-24 — End: 2017-10-20

## 2017-02-24 MED ORDER — NICOTINE 21 MG/24 HR DAILY TRANSDERMAL PATCH
MEDICATED_PATCH | TRANSDERMAL | 3 refills | 0.00000 days | Status: CP
Start: 2017-02-24 — End: 2017-06-24

## 2017-02-24 MED ORDER — PREDNISONE 10 MG TABLET
ORAL_TABLET | Freq: Every day | ORAL | 0 refills | 0 days | Status: CP
Start: 2017-02-24 — End: 2017-03-06

## 2017-02-24 MED ORDER — AZITHROMYCIN 250 MG TABLET
ORAL_TABLET | 0 refills | 0 days | Status: CP
Start: 2017-02-24 — End: 2017-04-01

## 2017-02-24 MED ORDER — ALBUTEROL SULFATE HFA 90 MCG/ACTUATION AEROSOL INHALER
Freq: Four times a day (QID) | RESPIRATORY_TRACT | 2 refills | 0.00000 days | Status: CP | PRN
Start: 2017-02-24 — End: 2017-10-20

## 2017-03-18 ENCOUNTER — Ambulatory Visit
Admission: RE | Admit: 2017-03-18 | Discharge: 2017-03-18 | Disposition: A | Discharge status: Home / Self Care | Payer: BC Managed Care – PPO

## 2017-03-18 ENCOUNTER — Ambulatory Visit
Admission: RE | Admit: 2017-03-18 | Discharge: 2017-03-18 | Disposition: A | Discharge status: Home / Self Care | Payer: BC Managed Care – PPO | Attending: Family Medicine | Admitting: Family Medicine

## 2017-03-18 DIAGNOSIS — I208 Other forms of angina pectoris: Principal | ICD-10-CM

## 2017-03-18 DIAGNOSIS — Z23 Encounter for immunization: Principal | ICD-10-CM

## 2017-03-18 DIAGNOSIS — I10 Essential (primary) hypertension: Secondary | ICD-10-CM

## 2017-03-18 MED ORDER — METOPROLOL TARTRATE 25 MG TABLET
ORAL_TABLET | Freq: Two times a day (BID) | ORAL | 11 refills | 0.00000 days | Status: CP
Start: 2017-03-18 — End: 2017-03-25

## 2017-03-19 MED ORDER — NITROGLYCERIN 0.4 MG SUBLINGUAL TABLET
ORAL_TABLET | SUBLINGUAL | 0 refills | 0 days | Status: CP | PRN
Start: 2017-03-19 — End: 2017-06-17

## 2017-03-24 ENCOUNTER — Emergency Department
Admission: EM | Admit: 2017-03-24 | Discharge: 2017-03-24 | Disposition: A | Discharge status: Home / Self Care | Payer: BC Managed Care – PPO | Source: Intra-hospital

## 2017-03-24 ENCOUNTER — Emergency Department
Admission: EM | Admit: 2017-03-24 | Discharge: 2017-03-24 | Disposition: A | Discharge status: Home / Self Care | Payer: BC Managed Care – PPO | Source: Intra-hospital | Admitting: Physician Assistant

## 2017-03-24 DIAGNOSIS — G43909 Migraine, unspecified, not intractable, without status migrainosus: Principal | ICD-10-CM

## 2017-03-24 MED ORDER — BUTALBITAL-ACETAMINOPHEN-CAFFEINE 50 MG-325 MG-40 MG TABLET
ORAL_TABLET | ORAL | 0 refills | 0.00000 days | Status: CP | PRN
Start: 2017-03-24 — End: 2017-03-28

## 2017-03-24 MED ORDER — PROMETHAZINE 12.5 MG TABLET
ORAL_TABLET | Freq: Four times a day (QID) | ORAL | 0 refills | 0 days | Status: CP | PRN
Start: 2017-03-24 — End: 2017-03-28

## 2017-03-25 ENCOUNTER — Ambulatory Visit
Admission: RE | Admit: 2017-03-25 | Discharge: 2017-03-25 | Payer: BC Managed Care – PPO | Attending: Family Medicine | Admitting: Family Medicine

## 2017-03-25 DIAGNOSIS — R079 Chest pain, unspecified: Principal | ICD-10-CM

## 2017-03-25 DIAGNOSIS — J449 Chronic obstructive pulmonary disease, unspecified: Secondary | ICD-10-CM

## 2017-03-25 MED ORDER — METOPROLOL TARTRATE 25 MG TABLET
ORAL_TABLET | Freq: Two times a day (BID) | ORAL | 3 refills | 0.00000 days | Status: CP
Start: 2017-03-25 — End: 2017-04-01

## 2017-03-25 MED ORDER — IPRATROPIUM 0.5 MG-ALBUTEROL 3 MG (2.5 MG BASE)/3 ML NEBULIZATION SOLN
Freq: Four times a day (QID) | RESPIRATORY_TRACT | 6 refills | 0 days | Status: CP | PRN
Start: 2017-03-25 — End: 2017-10-20

## 2017-04-01 ENCOUNTER — Ambulatory Visit: Admission: RE | Admit: 2017-04-01 | Discharge: 2017-04-01

## 2017-04-01 DIAGNOSIS — I208 Other forms of angina pectoris: Principal | ICD-10-CM

## 2017-04-01 DIAGNOSIS — Z720 Tobacco use: Secondary | ICD-10-CM

## 2017-04-01 DIAGNOSIS — E782 Mixed hyperlipidemia: Secondary | ICD-10-CM

## 2017-04-01 DIAGNOSIS — I10 Essential (primary) hypertension: Secondary | ICD-10-CM

## 2017-04-01 MED ORDER — CARVEDILOL 3.125 MG TABLET
ORAL_TABLET | Freq: Two times a day (BID) | ORAL | 6 refills | 0.00000 days | Status: CP
Start: 2017-04-01 — End: 2017-04-03

## 2017-04-02 ENCOUNTER — Ambulatory Visit
Admission: RE | Admit: 2017-04-02 | Discharge: 2017-04-03 | Disposition: A | Discharge status: Home / Self Care | Payer: BC Managed Care – PPO | Attending: Acute Care | Admitting: Acute Care

## 2017-04-02 MED ORDER — PRASUGREL 10 MG TABLET
ORAL_TABLET | ORAL | 3 refills | 0 days
Start: 2017-04-02 — End: 2017-04-02

## 2017-04-02 MED FILL — PRASUGREL/10MG/TABS: PRASUGREL/10MG/TABS | 30 days supply | Qty: 30 | Fill #0

## 2017-04-02 MED FILL — ASPIRIN/81MG/CHE: ASPIRIN/81MG/CHE | 36 days supply | Qty: 1 | Fill #0

## 2017-04-03 MED ORDER — ASPIRIN 81 MG CHEWABLE TABLET
ORAL_TABLET | Freq: Every day | ORAL | 3 refills | 0 days | Status: CP
Start: 2017-04-03 — End: 2017-04-27

## 2017-04-03 MED ORDER — PRASUGREL 10 MG TABLET
ORAL_TABLET | Freq: Every day | ORAL | 3 refills | 0.00000 days | Status: CP
Start: 2017-04-03 — End: 2017-04-27

## 2017-04-15 ENCOUNTER — Ambulatory Visit
Admission: RE | Admit: 2017-04-15 | Discharge: 2017-04-15 | Payer: BC Managed Care – PPO | Attending: Family Medicine | Admitting: Family Medicine

## 2017-04-15 DIAGNOSIS — S61011A Laceration without foreign body of right thumb without damage to nail, initial encounter: Principal | ICD-10-CM

## 2017-04-15 DIAGNOSIS — I2510 Atherosclerotic heart disease of native coronary artery without angina pectoris: Secondary | ICD-10-CM

## 2017-04-27 ENCOUNTER — Ambulatory Visit: Admission: RE | Admit: 2017-04-27 | Discharge: 2017-04-27

## 2017-04-27 ENCOUNTER — Emergency Department
Admission: EM | Admit: 2017-04-27 | Discharge: 2017-04-27 | Disposition: A | Discharge status: Home / Self Care | Payer: Worker's Compensation | Attending: Emergency Medicine | Admitting: Emergency Medicine

## 2017-04-27 ENCOUNTER — Other Ambulatory Visit: Payer: Self-pay

## 2017-04-27 ENCOUNTER — Encounter: Payer: Self-pay | Admitting: Emergency Medicine

## 2017-04-27 DIAGNOSIS — Y93G1 Activity, food preparation and clean up: Secondary | ICD-10-CM | POA: Diagnosis not present

## 2017-04-27 DIAGNOSIS — Y990 Civilian activity done for income or pay: Secondary | ICD-10-CM | POA: Insufficient documentation

## 2017-04-27 DIAGNOSIS — S68120A Partial traumatic metacarpophalangeal amputation of right index finger, initial encounter: Secondary | ICD-10-CM | POA: Insufficient documentation

## 2017-04-27 DIAGNOSIS — Z79899 Other long term (current) drug therapy: Secondary | ICD-10-CM | POA: Insufficient documentation

## 2017-04-27 DIAGNOSIS — W290XXA Contact with powered kitchen appliance, initial encounter: Secondary | ICD-10-CM | POA: Diagnosis not present

## 2017-04-27 DIAGNOSIS — S61310A Laceration without foreign body of right index finger with damage to nail, initial encounter: Secondary | ICD-10-CM

## 2017-04-27 DIAGNOSIS — Y92513 Shop (commercial) as the place of occurrence of the external cause: Secondary | ICD-10-CM | POA: Diagnosis not present

## 2017-04-27 DIAGNOSIS — I10 Essential (primary) hypertension: Secondary | ICD-10-CM | POA: Diagnosis not present

## 2017-04-27 DIAGNOSIS — F17210 Nicotine dependence, cigarettes, uncomplicated: Secondary | ICD-10-CM | POA: Insufficient documentation

## 2017-04-27 DIAGNOSIS — J449 Chronic obstructive pulmonary disease, unspecified: Secondary | ICD-10-CM | POA: Diagnosis not present

## 2017-04-27 DIAGNOSIS — S6991XA Unspecified injury of right wrist, hand and finger(s), initial encounter: Secondary | ICD-10-CM | POA: Diagnosis present

## 2017-04-27 DIAGNOSIS — S68119A Complete traumatic metacarpophalangeal amputation of unspecified finger, initial encounter: Secondary | ICD-10-CM

## 2017-04-27 DIAGNOSIS — S68129A Partial traumatic metacarpophalangeal amputation of unspecified finger, initial encounter: Secondary | ICD-10-CM

## 2017-04-27 DIAGNOSIS — E782 Mixed hyperlipidemia: Secondary | ICD-10-CM

## 2017-04-27 DIAGNOSIS — I2510 Atherosclerotic heart disease of native coronary artery without angina pectoris: Secondary | ICD-10-CM

## 2017-04-27 DIAGNOSIS — Z720 Tobacco use: Secondary | ICD-10-CM

## 2017-04-27 MED ORDER — ROSUVASTATIN 40 MG TABLET
ORAL_TABLET | Freq: Every day | ORAL | 6 refills | 0 days | Status: CP
Start: 2017-04-27 — End: 2017-06-24

## 2017-04-27 MED ORDER — PRASUGREL 10 MG TABLET
ORAL_TABLET | Freq: Every day | ORAL | 3 refills | 0 days | Status: CP
Start: 2017-04-27 — End: 2018-08-12

## 2017-04-27 MED ORDER — LIDOCAINE-EPINEPHRINE-TETRACAINE (LET) SOLUTION
NASAL | Status: AC
  Administered 2017-04-27: 3 mL via TOPICAL
  Filled 2017-04-27: qty 3

## 2017-04-27 MED ORDER — LIDOCAINE-EPINEPHRINE-TETRACAINE (LET) SOLUTION
3.0000 mL | Freq: Once | NASAL | Status: AC
  Administered 2017-04-27: 3 mL via TOPICAL

## 2017-04-27 MED ORDER — LIDOCAINE HCL (PF) 1 % IJ SOLN
INTRAMUSCULAR | Status: AC
  Filled 2017-04-27: qty 5

## 2017-04-27 NOTE — ED Notes (Signed)
Per profile no urine drug screen required for work injury per workers comp

## 2017-04-27 NOTE — Discharge Instructions (Signed)
You have a shaved fingertip. This wound was repaired using wound glue after the bleeding was controlled. Keep the wound clean, dry, and covered. Use the finger splint to protect the wound. Follow-up with your company's provider or [facility] as needed.

## 2017-04-27 NOTE — ED Notes (Signed)
tourniquet  released, bleeding began again. Per provider apply tourniquet again at this time.

## 2017-04-27 NOTE — ED Notes (Signed)
Pt holding direct pressure to lac at this time, states bleeding aprox 45 min

## 2017-04-27 NOTE — ED Triage Notes (Addendum)
Patient ambulatory to triage with steady gait, without difficulty or distress noted; pt reports cut right index fingertip while cutting [employer] ([employer])

## 2017-04-27 NOTE — ED Notes (Addendum)
tourniquet applied to finger to stop bleeding at this time by provider , finger soaking in LET solution

## 2017-04-28 NOTE — ED Provider Notes (Signed)
[facility] Emergency Department Provider Note ____________________________________________  Time seen: 2113  I have reviewed the triage vital signs and the nursing notes.  HISTORY  Chief Complaint  Laceration  HPI Juan Parsons is a 51 y.o. male presents to the ED from his employer, for evaluation of injury sustained after he accidentally cut the tip of his right ring finger while using a electric slicing machine.  He presents now with dry dressing in place but with active bleeding noted to the fingertip.  Patient with a history of coronary artery disease, is on a daily blood thinner.  He denies any other injury at this time he reports a current tetanus status.  Past Medical History:  Diagnosis Date  . ADD (attention deficit disorder)   . COPD (chronic obstructive pulmonary disease) (HCC)   . Migraines   . Opiate addiction [facility](HCC)     Patient Active Problem List   Diagnosis Date Noted  . Essential (primary) hypertension 10/04/2014  . H/O renal calculi 10/04/2014  . Tobacco abuse, in remission 10/04/2014  . Colitis 10/04/2014  . Chronic obstructive pulmonary emphysema (HCC) 10/04/2014    Past Surgical History:  Procedure Laterality Date  . CORONARY ANGIOPLASTY WITH STENT PLACEMENT    . NO PAST SURGERIES      Prior to Admission medications   Medication Sig Start Date End Date Taking? Authorizing Provider  albuterol (PROVENTIL HFA;VENTOLIN HFA) 108 (90 BASE) MCG/ACT inhaler Inhale 2 puffs into the lungs 4 (four) times daily. 06/11/14   [provider]  amphetamine-dextroamphetamine (ADDERALL XR) 30 MG 24 hr capsule Take 60 mg by mouth daily.    [provider]  amphetamine-dextroamphetamine (ADDERALL) 20 MG tablet Take 20 mg by mouth daily.    [provider]  budesonide-formoterol (SYMBICORT) 160-4.5 MCG/ACT inhaler Inhale 2 puffs into the lungs 2 (two) times daily.    [provider]  buprenorphine-naloxone  (SUBOXONE) 8-2 MG SUBL SL tablet Place 2.5 tablets under the tongue daily.    [provider]  hyoscyamine (LEVSIN SL) 0.125 MG SL tablet INSERT 1 TABLET UNDER THE TONGUE 4 TIMESA DAY 03/18/15   Duanne LimerickJones, Deanna C, MD  lisinopril (PRINIVIL,ZESTRIL) 10 MG tablet Take 1 tablet by mouth daily. 06/11/14   [provider]  omeprazole (PRILOSEC) 40 MG capsule Take 1 capsule by mouth daily. 06/11/14   [provider]  ondansetron (ZOFRAN) 4 MG tablet Take 4 mg by mouth every 8 (eight) hours as needed for nausea or vomiting.    [provider]    Allergies Gemfibrozil  No family history on file.  Social History Social History   Tobacco Use  . Smoking status: Current Every Day Smoker    Packs/day: 1.00    Types: Cigarettes  . Smokeless tobacco: Never Used  Substance Use Topics  . Alcohol use: No    Alcohol/week: 0.0 oz  . Drug use: No    Review of Systems  Constitutional: Negative for fever. Cardiovascular: Negative for chest pain. Respiratory: Negative for shortness of breath. Musculoskeletal: Negative for back pain. Skin: Negative for rash.  Right index finger laceration as above. Neurological: Negative for headaches, focal weakness or numbness. ____________________________________________  PHYSICAL EXAM:  VITAL SIGNS: ED Triage Vitals  Enc Vitals Group     BP 04/27/17 2100 (!) 136/102     Pulse Rate 04/27/17 2100 99     Resp 04/27/17 2100 18     Temp 04/27/17 2100 97.9 F (36.6 C)     Temp  Source 04/27/17 2100 Oral     SpO2 04/27/17 2100 99 %     Weight 04/27/17 2058 185 lb (83.9 kg)     Height 04/27/17 2058 6\' 1"  (1.854 m)     Head Circumference --      Peak Flow --      Pain Score 04/27/17 2058 8     Pain Loc --      Pain Edu? --      Excl. in GC? --     Constitutional: Alert and oriented. Well appearing and in no distress. Head: Normocephalic and atraumatic. Cardiovascular: Normal rate, regular rhythm. Normal distal  pulses. Respiratory: Normal respiratory effort. No wheezes/rales/rhonchi. Musculoskeletal: Right index finger with a shave laceration the distal lateral fingertip and nailbed.  Active bleeding is noted at this time.  Normal composite fist.  Nontender with normal range of motion in all extremities.  Neurologic:  Normal gross sensation. Normal speech and language. No gross focal neurologic deficits are appreciated. Skin:  Skin is warm, dry and intact. No rash noted. ____________________________________________  PROCEDURES  .Marland Kitchen.Laceration Repair Date/Time: 04/28/2017 12:50 AM Performed by: Lissa HoardMenshew, Phyliss Hulick V Bacon, PA-C Authorized by: Lissa HoardMenshew, Frandy Basnett V Bacon, PA-C   Consent:    Consent obtained:  Verbal   Consent given by:  Patient   Risks discussed:  Infection Anesthesia (see MAR for exact dosages):    Anesthesia method:  Topical application   Topical anesthetic:  LET Laceration details:    Location:  Finger   Finger location:  R index finger   Length (cm):  3   Depth (mm):  5 Repair type:    Repair type:  Intermediate Exploration:    Hemostasis achieved with:  LET and tourniquet   Contaminated: no   Treatment:    Area cleansed with:  Saline   Amount of cleaning:  Standard Skin repair:    Repair method:  Tissue adhesive (Applied to open fingertip/nailbed shave laceration. ) Post-procedure details:    Dressing:  Non-adherent dressing, splint for protection, tube gauze and sterile dressing (Surgicel applied to wound following adhesive)   Patient tolerance of procedure:  Tolerated well, no immediate complications  ____________________________________________  INITIAL IMPRESSION / ASSESSMENT AND PLAN / ED COURSE  Patient with ED evaluation and accidental, work-related injury to the right index finger.  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  Patient presents with an actively bleeding wound without need for suture  repair.  After hemostasis is obtained, the dry wound bed is covered in wound adhesive.  A Surgicel gauze is applied and a non-stick dry sterile dressings applied on top of that.  The splint is provided for further protection.  Wound care directions are provided and the patient is discharged to return to work with activities as tolerated.  He should follow-up with his company's medical provider as needed. ____________________________________________  FINAL CLINICAL IMPRESSION(S) / ED DIAGNOSES  Final diagnoses:  Fingertip amputation, initial encounter  Laceration of right index finger without foreign body with damage to nail, initial encounter      Lissa HoardMenshew, Woodward Klem V Bacon, PA-C 04/28/17 0054    Minna AntisPaduchowski, Kevin, MD 05/01/17 1440

## 2017-05-19 ENCOUNTER — Ambulatory Visit
Admission: RE | Admit: 2017-05-19 | Discharge: 2017-05-19 | Disposition: A | Discharge status: Home / Self Care | Payer: BC Managed Care – PPO | Attending: Family Medicine | Admitting: Family Medicine

## 2017-05-19 DIAGNOSIS — E0590 Thyrotoxicosis, unspecified without thyrotoxic crisis or storm: Secondary | ICD-10-CM

## 2017-05-19 DIAGNOSIS — I2510 Atherosclerotic heart disease of native coronary artery without angina pectoris: Secondary | ICD-10-CM

## 2017-05-19 DIAGNOSIS — J449 Chronic obstructive pulmonary disease, unspecified: Secondary | ICD-10-CM

## 2017-05-19 DIAGNOSIS — Z0000 Encounter for general adult medical examination without abnormal findings: Principal | ICD-10-CM

## 2017-05-19 DIAGNOSIS — F17200 Nicotine dependence, unspecified, uncomplicated: Secondary | ICD-10-CM

## 2017-05-19 MED ORDER — VARENICLINE 1 MG TABLET
ORAL_TABLET | Freq: Two times a day (BID) | ORAL | 2 refills | 0.00000 days | Status: CP
Start: 2017-05-19 — End: 2017-06-24

## 2017-05-28 ENCOUNTER — Observation Stay
Admission: EM | Admit: 2017-05-28 | Discharge: 2017-05-29 | Disposition: A | Discharge status: Home / Self Care | Source: Intra-hospital | Attending: Cardiovascular Disease | Admitting: Cardiovascular Disease

## 2017-05-28 ENCOUNTER — Observation Stay
Admission: EM | Admit: 2017-05-28 | Discharge: 2017-05-29 | Disposition: A | Discharge status: Home / Self Care | Payer: BC Managed Care – PPO | Source: Intra-hospital

## 2017-05-28 DIAGNOSIS — R0789 Other chest pain: Principal | ICD-10-CM

## 2017-05-29 DIAGNOSIS — R0789 Other chest pain: Principal | ICD-10-CM

## 2017-06-24 ENCOUNTER — Ambulatory Visit
Admit: 2017-06-24 | Discharge: 2017-06-25 | Payer: PRIVATE HEALTH INSURANCE | Attending: Adult Health | Primary: Adult Health

## 2017-06-24 DIAGNOSIS — F17200 Nicotine dependence, unspecified, uncomplicated: Secondary | ICD-10-CM

## 2017-06-24 DIAGNOSIS — I2510 Atherosclerotic heart disease of native coronary artery without angina pectoris: Principal | ICD-10-CM

## 2017-06-24 DIAGNOSIS — I10 Essential (primary) hypertension: Secondary | ICD-10-CM

## 2017-06-24 DIAGNOSIS — E782 Mixed hyperlipidemia: Secondary | ICD-10-CM

## 2017-06-24 MED ORDER — ROSUVASTATIN 40 MG TABLET
ORAL_TABLET | Freq: Every day | ORAL | 6 refills | 0 days | Status: CP
Start: 2017-06-24 — End: 2017-06-30

## 2017-06-30 ENCOUNTER — Encounter
Admit: 2017-06-30 | Discharge: 2017-07-01 | Payer: PRIVATE HEALTH INSURANCE | Attending: Family Medicine | Primary: Family Medicine

## 2017-06-30 DIAGNOSIS — E0590 Thyrotoxicosis, unspecified without thyrotoxic crisis or storm: Secondary | ICD-10-CM

## 2017-06-30 DIAGNOSIS — F17200 Nicotine dependence, unspecified, uncomplicated: Secondary | ICD-10-CM

## 2017-06-30 DIAGNOSIS — I10 Essential (primary) hypertension: Principal | ICD-10-CM

## 2017-06-30 DIAGNOSIS — I2510 Atherosclerotic heart disease of native coronary artery without angina pectoris: Secondary | ICD-10-CM

## 2017-06-30 DIAGNOSIS — E782 Mixed hyperlipidemia: Secondary | ICD-10-CM

## 2017-06-30 MED ORDER — ATORVASTATIN 80 MG TABLET
ORAL_TABLET | Freq: Every day | ORAL | 11 refills | 0 days | Status: CP
Start: 2017-06-30 — End: 2018-07-12

## 2017-07-14 ENCOUNTER — Encounter
Admit: 2017-07-14 | Discharge: 2017-07-15 | Payer: PRIVATE HEALTH INSURANCE | Attending: Internal Medicine | Primary: Internal Medicine

## 2017-07-14 DIAGNOSIS — I10 Essential (primary) hypertension: Secondary | ICD-10-CM

## 2017-07-14 DIAGNOSIS — E0590 Thyrotoxicosis, unspecified without thyrotoxic crisis or storm: Principal | ICD-10-CM

## 2017-07-14 DIAGNOSIS — R7303 Prediabetes: Secondary | ICD-10-CM

## 2017-07-14 DIAGNOSIS — R7989 Other specified abnormal findings of blood chemistry: Secondary | ICD-10-CM

## 2017-07-14 DIAGNOSIS — N200 Calculus of kidney: Secondary | ICD-10-CM

## 2017-07-15 ENCOUNTER — Ambulatory Visit
Admit: 2017-07-15 | Discharge: 2017-07-16 | Payer: PRIVATE HEALTH INSURANCE | Attending: Adult Health | Primary: Adult Health

## 2017-07-15 DIAGNOSIS — I2510 Atherosclerotic heart disease of native coronary artery without angina pectoris: Secondary | ICD-10-CM

## 2017-07-15 DIAGNOSIS — F17200 Nicotine dependence, unspecified, uncomplicated: Secondary | ICD-10-CM

## 2017-07-15 DIAGNOSIS — J449 Chronic obstructive pulmonary disease, unspecified: Principal | ICD-10-CM

## 2017-07-15 DIAGNOSIS — I10 Essential (primary) hypertension: Secondary | ICD-10-CM

## 2017-07-15 MED ORDER — CARVEDILOL 6.25 MG TABLET
ORAL_TABLET | Freq: Two times a day (BID) | ORAL | 3 refills | 0 days | Status: CP
Start: 2017-07-15 — End: 2018-11-24

## 2017-08-11 MED ORDER — LISINOPRIL 10 MG TABLET
ORAL_TABLET | 1 refills | 0 days | Status: CP
Start: 2017-08-11 — End: 2018-03-03

## 2017-08-18 ENCOUNTER — Other Ambulatory Visit: Payer: Self-pay

## 2017-08-18 ENCOUNTER — Ambulatory Visit
Admission: EM | Admit: 2017-08-18 | Discharge: 2017-08-18 | Disposition: A | Discharge status: Home / Self Care | Payer: BLUE CROSS/BLUE SHIELD | Attending: Family Medicine | Admitting: Family Medicine

## 2017-08-18 ENCOUNTER — Encounter: Payer: Self-pay | Admitting: *Deleted

## 2017-08-18 DIAGNOSIS — R1013 Epigastric pain: Secondary | ICD-10-CM | POA: Diagnosis not present

## 2017-08-18 LAB — COMPREHENSIVE METABOLIC PANEL
ALBUMIN: 4.1 g/dL (ref 3.5–5.0)
ALT: 21 U/L (ref 17–63)
ANION GAP: 7 (ref 5–15)
AST: 24 U/L (ref 15–41)
Alkaline Phosphatase: 92 U/L (ref 38–126)
BUN: 10 mg/dL (ref 6–20)
CO2: 27 mmol/L (ref 22–32)
Calcium: 9 mg/dL (ref 8.9–10.3)
Chloride: 98 mmol/L — ABNORMAL LOW (ref 101–111)
Creatinine, Ser: 0.79 mg/dL (ref 0.61–1.24)
GFR calc Af Amer: >60 mL/min (ref >60)
GFR calc non Af Amer: >60 mL/min (ref >60)
Glucose, Bld: 98 mg/dL (ref 65–99)
POTASSIUM: 4.4 mmol/L (ref 3.5–5.1)
Sodium: 132 mmol/L — ABNORMAL LOW (ref 135–145)
TOTAL PROTEIN: 7.8 g/dL (ref 6.5–8.1)
Total Bilirubin: 0.5 mg/dL (ref 0.3–1.2)

## 2017-08-18 LAB — LIPASE, BLOOD: Lipase: 52 U/L — ABNORMAL HIGH (ref 11–51)

## 2017-08-18 MED ORDER — GI COCKTAIL ~~LOC~~
30.0000 mL | Freq: Once | ORAL | Status: AC
  Administered 2017-08-18: 30 mL via ORAL

## 2017-08-18 NOTE — Discharge Instructions (Signed)
Continue home medications; clear liquids then advance diet slowly as tolerated

## 2017-08-18 NOTE — ED Provider Notes (Signed)
[facility]    CSN: [id number] Arrival date & time: 08/18/17  1917     History   Chief Complaint Chief Complaint  Patient presents with  . Abdominal Pain  . Nasal Congestion  . Nausea    HPI Juan Parsons is a 52 y.o. male.   The history is provided by the patient.  Abdominal Pain  Pain location:  Epigastric Pain quality: aching   Pain radiates to:  Does not radiate Pain severity:  Mild Onset quality:  Gradual Duration:  5 days Timing:  Intermittent Progression:  Waxing and waning Chronicity:  Recurrent Context: not alcohol use, not awakening from sleep, not diet changes, not eating, not laxative use, not medication withdrawal, not previous surgeries, not recent illness, not recent sexual activity, not recent travel, not retching, not sick contacts, not suspicious food intake and not trauma   Relieved by:  None tried Worsened by:  Palpation Ineffective treatments:  None tried Associated symptoms: nausea   Associated symptoms: no anorexia, no belching, no chest pain, no chills, no constipation, no cough, no diarrhea, no dysuria, no fatigue, no fever, no flatus, no hematemesis, no hematochezia, no hematuria, no melena, no shortness of breath, no sore throat, no vaginal bleeding, no vaginal discharge and no vomiting     Past Medical History:  Diagnosis Date  . ADD (attention deficit disorder)   . COPD (chronic obstructive pulmonary disease) (HCC)   . Migraines   . Opiate addiction [facility])     Patient Active Problem List   Diagnosis Date Noted  . Essential (primary) hypertension 10/04/2014  . H/O renal calculi 10/04/2014  . Tobacco abuse, in remission 10/04/2014  . Colitis 10/04/2014  . Chronic obstructive pulmonary emphysema (HCC) 10/04/2014    Past Surgical History:  Procedure Laterality Date  . CORONARY ANGIOPLASTY WITH STENT PLACEMENT    . NO PAST SURGERIES         Home Medications    Prior to Admission medications   Medication Sig  Start Date End Date Taking? Authorizing Provider  albuterol (PROVENTIL HFA;VENTOLIN HFA) 108 (90 BASE) MCG/ACT inhaler Inhale 2 puffs into the lungs 4 (four) times daily. 06/11/14  Yes [provider]  amphetamine-dextroamphetamine (ADDERALL XR) 30 MG 24 hr capsule Take 60 mg by mouth daily.   Yes [provider]  amphetamine-dextroamphetamine (ADDERALL) 20 MG tablet Take 20 mg by mouth daily.   Yes [provider]  budesonide-formoterol (SYMBICORT) 160-4.5 MCG/ACT inhaler Inhale 2 puffs into the lungs 2 (two) times daily.   Yes [provider]  buprenorphine-naloxone (SUBOXONE) 8-2 MG SUBL SL tablet Place 2.5 tablets under the tongue daily.   Yes [provider]  hyoscyamine (LEVSIN SL) 0.125 MG SL tablet INSERT 1 TABLET UNDER THE TONGUE 4 TIMESA DAY 03/18/15  Yes Duanne Limerick, MD  lisinopril (PRINIVIL,ZESTRIL) 10 MG tablet Take 1 tablet by mouth daily. 06/11/14  Yes [provider]  omeprazole (PRILOSEC) 40 MG capsule Take 1 capsule by mouth daily. 06/11/14  Yes [provider]  ondansetron (ZOFRAN) 4 MG tablet Take 4 mg by mouth every 8 (eight) hours as needed for nausea or vomiting.   Yes [provider]    Family History History reviewed. No pertinent family history.  Social History Social History   Tobacco Use  . Smoking status: Current Every Day Smoker    Packs/day: 1.00    Types: Cigarettes  . Smokeless tobacco: Never Used  Substance Use Topics  . Alcohol use: No  Alcohol/week: 0.0 oz  . Drug use: No     Allergies   Gemfibrozil; Metoprolol; Statins; and Lexapro [escitalopram oxalate]   Review of Systems Review of Systems  Constitutional: Negative for chills, fatigue and fever.  HENT: Negative for sore throat.   Respiratory: Negative for cough and shortness of breath.   Cardiovascular: Negative for chest pain.  Gastrointestinal: Positive for abdominal pain and nausea. Negative for anorexia,  constipation, diarrhea, flatus, hematemesis, hematochezia, melena and vomiting.  Genitourinary: Negative for dysuria, hematuria, vaginal bleeding and vaginal discharge.     Physical Exam Triage Vital Signs ED Triage Vitals  Enc Vitals Group     BP 08/18/17 1955 (!) 145/91     Pulse Rate 08/18/17 1955 88     Resp 08/18/17 1955 18     Temp 08/18/17 1955 98.2 F (36.8 C)     Temp Source 08/18/17 1955 Oral     SpO2 08/18/17 1955 99 %     Weight 08/18/17 1956 182 lb (82.6 kg)     Height 08/18/17 1956 6' (1.829 m)     Head Circumference --      Peak Flow --      Pain Score 08/18/17 1956 5     Pain Loc --      Pain Edu? --      Excl. in GC? --    No data found.  Updated Vital Signs BP (!) 145/91 (BP Location: Left Arm)   Pulse 88   Temp 98.2 F (36.8 C) (Oral)   Resp 18   Ht 6' (1.829 m)   Wt 182 lb (82.6 kg)   SpO2 99%   BMI 24.68 kg/m   Visual Acuity Right Eye Distance:   Left Eye Distance:   Bilateral Distance:    Right Eye Near:   Left Eye Near:    Bilateral Near:     Physical Exam  Constitutional: He is oriented to person, place, and time. He appears well-developed and well-nourished. No distress.  HENT:  Head: Normocephalic and atraumatic.  Cardiovascular: Normal rate, regular rhythm, normal heart sounds and intact distal pulses.  No murmur heard. Pulmonary/Chest: Effort normal and breath sounds normal. No respiratory distress. He has no wheezes. He has no rales.  Abdominal: Soft. Bowel sounds are normal. He exhibits no distension and no mass. There is tenderness (mild, epigastric). There is no rebound and no guarding.  Neurological: He is alert and oriented to person, place, and time.  Skin: No rash noted. He is not diaphoretic.  Nursing note and vitals reviewed.    UC Treatments / Results  Labs (all labs ordered are listed, but only abnormal results are displayed) Labs Reviewed  COMPREHENSIVE METABOLIC PANEL - Abnormal; Notable for the following  components:      Result Value   Sodium 132 (*)    Chloride 98 (*)    All other components within normal limits  LIPASE, BLOOD - Abnormal; Notable for the following components:   Lipase 52 (*)    All other components within normal limits    EKG  EKG Interpretation None       Radiology No results found.  Procedures Procedures (including critical care time)  Medications Ordered in UC Medications  gi cocktail (Maalox,Lidocaine,Donnatal) (30 mLs Oral Given 08/18/17 2045)     Initial Impression / Assessment and Plan / UC Course  I have reviewed the triage vital signs and the nursing notes.  Pertinent labs & imaging results that were available during my  care of the patient were reviewed by me and considered in my medical decision making (see chart for details).       Final Clinical Impressions(s) / UC Diagnoses   Final diagnoses:  Dyspepsia    ED Discharge Orders    None     1. Lab results and diagnosis reviewed with patient 2.given GI cocktail with improvement of symptoms 3. Recommend supportive treatment with clear liquids; zofran patient has at home 4. Follow-up prn if symptoms worsen or don't improve  Controlled Substance Prescriptions [place] Controlled Substance Registry consulted? Not Applicable   Payton Mccallum, MD 08/18/17 2208

## 2017-08-18 NOTE — ED Triage Notes (Signed)
Patient started having symptom of abdominal pain 5 days ago. Symptom of nausea and nasal congestion started 3 days ago. Patient has a history of gastric issues.

## 2017-08-20 ENCOUNTER — Non-Acute Institutional Stay: Admit: 2017-08-20 | Discharge: 2017-08-20 | Payer: PRIVATE HEALTH INSURANCE

## 2017-08-20 ENCOUNTER — Encounter
Admit: 2017-08-20 | Discharge: 2017-08-20 | Payer: PRIVATE HEALTH INSURANCE | Attending: Family Medicine | Primary: Family Medicine

## 2017-08-20 DIAGNOSIS — E0781 Sick-euthyroid syndrome: Secondary | ICD-10-CM

## 2017-08-20 DIAGNOSIS — F17200 Nicotine dependence, unspecified, uncomplicated: Secondary | ICD-10-CM

## 2017-08-20 DIAGNOSIS — R1084 Generalized abdominal pain: Secondary | ICD-10-CM

## 2017-08-20 DIAGNOSIS — I2510 Atherosclerotic heart disease of native coronary artery without angina pectoris: Principal | ICD-10-CM

## 2017-08-21 ENCOUNTER — Ambulatory Visit: Admit: 2017-08-21 | Discharge: 2017-08-22 | Payer: PRIVATE HEALTH INSURANCE

## 2017-08-21 DIAGNOSIS — R1084 Generalized abdominal pain: Principal | ICD-10-CM

## 2017-08-26 ENCOUNTER — Encounter
Admit: 2017-08-26 | Discharge: 2017-08-27 | Payer: PRIVATE HEALTH INSURANCE | Attending: Family Medicine | Primary: Family Medicine

## 2017-08-26 DIAGNOSIS — R319 Hematuria, unspecified: Secondary | ICD-10-CM

## 2017-08-26 DIAGNOSIS — R1033 Periumbilical pain: Principal | ICD-10-CM

## 2017-09-01 DIAGNOSIS — R079 Chest pain, unspecified: Principal | ICD-10-CM

## 2017-09-02 ENCOUNTER — Encounter: Admit: 2017-09-02 | Discharge: 2017-09-02 | Payer: PRIVATE HEALTH INSURANCE

## 2017-09-23 ENCOUNTER — Encounter
Admit: 2017-09-23 | Discharge: 2017-09-24 | Payer: PRIVATE HEALTH INSURANCE | Attending: Adult Health | Primary: Adult Health

## 2017-09-23 DIAGNOSIS — R079 Chest pain, unspecified: Secondary | ICD-10-CM

## 2017-09-23 DIAGNOSIS — I25118 Atherosclerotic heart disease of native coronary artery with other forms of angina pectoris: Principal | ICD-10-CM

## 2017-09-23 DIAGNOSIS — F17200 Nicotine dependence, unspecified, uncomplicated: Secondary | ICD-10-CM

## 2017-09-23 DIAGNOSIS — I10 Essential (primary) hypertension: Secondary | ICD-10-CM

## 2017-09-23 DIAGNOSIS — F1910 Other psychoactive substance abuse, uncomplicated: Secondary | ICD-10-CM

## 2017-09-23 DIAGNOSIS — E782 Mixed hyperlipidemia: Secondary | ICD-10-CM

## 2017-09-23 MED ORDER — EZETIMIBE 10 MG TABLET
ORAL_TABLET | Freq: Every day | ORAL | 3 refills | 0 days | Status: CP
Start: 2017-09-23 — End: 2018-09-23

## 2017-09-23 MED ORDER — ISOSORBIDE MONONITRATE ER 30 MG TABLET,EXTENDED RELEASE 24 HR
ORAL_TABLET | Freq: Every day | ORAL | 6 refills | 0.00000 days | Status: CP
Start: 2017-09-23 — End: 2018-02-17

## 2017-09-24 ENCOUNTER — Ambulatory Visit
Admit: 2017-09-24 | Discharge: 2017-09-25 | Payer: PRIVATE HEALTH INSURANCE | Attending: Family Medicine | Primary: Family Medicine

## 2017-09-24 DIAGNOSIS — R319 Hematuria, unspecified: Secondary | ICD-10-CM

## 2017-09-24 DIAGNOSIS — R1033 Periumbilical pain: Principal | ICD-10-CM

## 2017-09-29 ENCOUNTER — Ambulatory Visit
Admit: 2017-09-29 | Discharge: 2017-09-30 | Payer: PRIVATE HEALTH INSURANCE | Attending: Gastroenterology | Primary: Gastroenterology

## 2017-09-29 DIAGNOSIS — R1310 Dysphagia, unspecified: Secondary | ICD-10-CM

## 2017-09-29 DIAGNOSIS — R1033 Periumbilical pain: Principal | ICD-10-CM

## 2017-09-30 ENCOUNTER — Encounter: Admit: 2017-09-30 | Discharge: 2017-09-30 | Payer: PRIVATE HEALTH INSURANCE

## 2017-09-30 DIAGNOSIS — R1033 Periumbilical pain: Principal | ICD-10-CM

## 2017-10-01 ENCOUNTER — Ambulatory Visit
Admission: EM | Admit: 2017-10-01 | Discharge: 2017-10-01 | Disposition: A | Discharge status: Home / Self Care | Payer: BLUE CROSS/BLUE SHIELD | Attending: Family Medicine | Admitting: Family Medicine

## 2017-10-01 ENCOUNTER — Encounter: Payer: Self-pay | Admitting: *Deleted

## 2017-10-01 DIAGNOSIS — M26609 Unspecified temporomandibular joint disorder, unspecified side: Secondary | ICD-10-CM

## 2017-10-01 MED ORDER — MELOXICAM 15 MG PO TABS: 15.0000 mg | ORAL_TABLET | Freq: Every day | ORAL | 0 refills | Status: DC

## 2017-10-01 NOTE — ED Provider Notes (Signed)
[facility]    CSN: [id number] Arrival date & time: 10/01/17  1526     History   Chief Complaint Chief Complaint  Patient presents with  . Otalgia    HPI Juan Parsons is a 52 y.o. male.   HPI  52 year old male with multiple comorbidities presents with right ear pain and right face pain indicating just inferior to the ear that has had for 5 days.Has a history of hypertension, tobacco abuse, chronic obstructive pulmonary disease, coronary artery disease, esophageal dysphasia.  Currently taking Effient and aspirin also  Taking  Suboxone.  States his doctors recommended he come here because of his history of CAD.  He said it does bother him when he chews.  Has very few teeth remaining.  Had no drainage from his ear.  He has no loss of hearing.  Denies any tinnitus.  Has no chest pain.          Past Medical History:  Diagnosis Date  . ADD (attention deficit disorder)   . COPD (chronic obstructive pulmonary disease) (HCC)   . Migraines   . Opiate addiction [facility])     Patient Active Problem List   Diagnosis Date Noted  . Essential (primary) hypertension 10/04/2014  . H/O renal calculi 10/04/2014  . Tobacco abuse, in remission 10/04/2014  . Colitis 10/04/2014  . Chronic obstructive pulmonary emphysema (HCC) 10/04/2014    Past Surgical History:  Procedure Laterality Date  . CORONARY ANGIOPLASTY WITH STENT PLACEMENT    . NO PAST SURGERIES         Home Medications    Prior to Admission medications   Medication Sig Start Date End Date Taking? Authorizing Provider  amphetamine-dextroamphetamine (ADDERALL XR) 30 MG 24 hr capsule Take 60 mg by mouth daily.   Yes [provider]  amphetamine-dextroamphetamine (ADDERALL) 20 MG tablet Take 20 mg by mouth daily.   Yes [provider]  aspirin 81 MG chewable tablet Chew by mouth daily.   Yes [provider]  atorvastatin (LIPITOR) 80 MG tablet Take 80 mg by mouth daily.   Yes  [provider]  budesonide-formoterol (SYMBICORT) 160-4.5 MCG/ACT inhaler Inhale 2 puffs into the lungs 2 (two) times daily.   Yes [provider]  buprenorphine-naloxone (SUBOXONE) 8-2 MG SUBL SL tablet Place 2.5 tablets under the tongue daily.   Yes [provider]  carvedilol (COREG) 6.25 MG tablet Take 6.25 mg by mouth 2 (two) times daily with a meal.   Yes [provider]  ezetimibe-simvastatin (VYTORIN) 10-10 MG tablet Take 1 tablet by mouth at bedtime.   Yes [provider]  isosorbide dinitrate (ISORDIL) 30 MG tablet Take 30 mg by mouth 4 (four) times daily.   Yes [provider]  lisinopril (PRINIVIL,ZESTRIL) 10 MG tablet Take 1 tablet by mouth daily. 06/11/14  Yes [provider]  omeprazole (PRILOSEC) 40 MG capsule Take 1 capsule by mouth daily. 06/11/14  Yes [provider]  prasugrel (EFFIENT) 10 MG TABS tablet Take 10 mg by mouth daily.   Yes [provider]  QUEtiapine (SEROQUEL) 50 MG tablet Take 50 mg by mouth at bedtime.   Yes [provider]  albuterol (PROVENTIL HFA;VENTOLIN HFA) 108 (90 BASE) MCG/ACT inhaler Inhale 2 puffs into the lungs 4 (four) times daily. 06/11/14   [provider]  hyoscyamine (LEVSIN SL) 0.125 MG SL tablet INSERT 1 TABLET UNDER THE TONGUE 4 TIMESA DAY 03/18/15   Duanne Limerick, MD  meloxicam [facility]) 15  MG tablet Take 1 tablet (15 mg total) by mouth daily. 10/01/17   Lutricia Feil, PA-C  ondansetron (ZOFRAN) 4 MG tablet Take 4 mg by mouth every 8 (eight) hours as needed for nausea or vomiting.    [provider]    Family History History reviewed. No pertinent family history.  Social History Social History   Tobacco Use  . Smoking status: Current Every Day Smoker    Packs/day: 0.50    Types: Cigarettes  . Smokeless tobacco: Never Used  Substance Use Topics  . Alcohol use: No    Alcohol/week: 0.0 oz  . Drug use: No     Allergies     Gemfibrozil; Metoprolol; Statins; and Lexapro [escitalopram oxalate]   Review of Systems Review of Systems  Constitutional: Negative for activity change, appetite change, chills, fatigue and fever.  HENT: Positive for ear pain.   Cardiovascular: Negative for chest pain.  All other systems reviewed and are negative.    Physical Exam Triage Vital Signs ED Triage Vitals  Enc Vitals Group     BP 10/01/17 1539 127/82     Pulse Rate 10/01/17 1539 88     Resp 10/01/17 1539 16     Temp 10/01/17 1539 (!) 97 F (36.1 C)     Temp Source 10/01/17 1539 Oral     SpO2 10/01/17 1539 97 %     Weight 10/01/17 1536 168 lb (76.2 kg)     Height 10/01/17 1536 6' (1.829 m)     Head Circumference --      Peak Flow --      Pain Score 10/01/17 1535 5     Pain Loc --      Pain Edu? --      Excl. in GC? --    No data found.  Updated Vital Signs BP 127/82 (BP Location: Left Arm)   Pulse 88   Temp (!) 97 F (36.1 C) (Oral)   Resp 16   Ht 6' (1.829 m)   Wt 168 lb (76.2 kg)   SpO2 97%   BMI 22.78 kg/m    Visual Acuity Right Eye Distance:   Left Eye Distance:   Bilateral Distance:    Right Eye Near:   Left Eye Near:    Bilateral Near:     Physical Exam  Constitutional: He is oriented to person, place, and time. He appears well-developed and well-nourished. No distress.  HENT:  Head: Normocephalic.  Right Ear: External ear normal.  Left Ear: External ear normal.  Nose: Nose normal.  Mouth/Throat: Oropharynx is clear and moist. No oropharyngeal exudate.  Examination of the right and left ears shows the TMs to be normal with the right TM sclerosis inferiorly.  Canals are normal.  He does have tenderness over the right TMJ that is exacerbated with mastication  Reproducing his symptoms.  Examination of his gums show him to be edentulous on the uppers on that side with no evidence of abscess specifically no induration or fluctuance.  There is no tenderness of the gums.  No palpable  tenderness over the parotid gland.  Eyes: Pupils are equal, round, and reactive to light. Right eye exhibits no discharge. Left eye exhibits no discharge.  Neck: Normal range of motion.  Cardiovascular: Normal rate and regular rhythm.  Musculoskeletal: Normal range of motion.  Neurological: He is alert and oriented to person, place, and time.  Skin: Skin is warm and dry. He is not diaphoretic.  Psychiatric: He has a  normal mood and affect. His behavior is normal. Judgment and thought content normal.  Nursing note and vitals reviewed.    UC Treatments / Results  Labs (all labs ordered are listed, but only abnormal results are displayed) Labs Reviewed - No data to display  EKG None Radiology No results found.  Procedures Procedures (including critical care time)  Medications Ordered in UC Medications - No data to display   Initial Impression / Assessment and Plan / UC Course  I have reviewed the triage vital signs and the nursing notes.  Pertinent labs & imaging results that were available during my care of the patient were reviewed by me and considered in my medical decision making (see chart for details).     Plan: 1. Test/x-ray results and diagnosis reviewed with patient 2. rx as per orders; risks, benefits, potential side effects reviewed with patient 3. Recommend supportive treatment with soft foods.  Use ice packs on the TMJ for comfort.  We will place him on for 2 weeks duration.  He is currently taken aspirin and Effient and should clear this with his cardiologist ;use for short duration should not be a problem.  If he is are not improving he should follow-up with his primary care physician. 4. F/u prn if symptoms worsen or don't improve   Final Clinical Impressions(s) / UC Diagnoses   Final diagnoses:  TMJ (temporomandibular joint syndrome)    ED Discharge Orders        Ordered    meloxicam (MOBIC) 15 MG tablet  Daily     10/01/17 1618       Controlled  Substance Prescriptions [place] Controlled Substance Registry consulted? Not Applicable  Lutricia FeilRoemer, Azzam Mehra P, PA-C 10/01/17 1641

## 2017-10-01 NOTE — ED Triage Notes (Signed)
C/o right ear and right face pain that has been going on for 5 days.

## 2017-10-06 ENCOUNTER — Encounter: Admit: 2017-10-06 | Discharge: 2017-10-06 | Payer: PRIVATE HEALTH INSURANCE

## 2017-10-20 MED ORDER — ALBUTEROL SULFATE HFA 90 MCG/ACTUATION AEROSOL INHALER
Freq: Four times a day (QID) | RESPIRATORY_TRACT | 2 refills | 0 days | Status: CP | PRN
Start: 2017-10-20 — End: 2018-08-12

## 2017-10-20 MED ORDER — BUDESONIDE-FORMOTEROL HFA 160 MCG-4.5 MCG/ACTUATION AEROSOL INHALER
Freq: Two times a day (BID) | RESPIRATORY_TRACT | 3 refills | 0 days | Status: CP
Start: 2017-10-20 — End: 2018-08-12

## 2017-10-20 MED ORDER — IPRATROPIUM 0.5 MG-ALBUTEROL 3 MG (2.5 MG BASE)/3 ML NEBULIZATION SOLN
Freq: Four times a day (QID) | RESPIRATORY_TRACT | 6 refills | 0 days | Status: CP | PRN
Start: 2017-10-20 — End: 2018-10-20

## 2017-10-21 ENCOUNTER — Encounter: Admit: 2017-10-21 | Discharge: 2017-11-03 | Payer: PRIVATE HEALTH INSURANCE

## 2017-10-21 ENCOUNTER — Ambulatory Visit: Admit: 2017-10-21 | Discharge: 2017-11-03 | Payer: PRIVATE HEALTH INSURANCE

## 2017-10-21 DIAGNOSIS — R1033 Periumbilical pain: Principal | ICD-10-CM

## 2017-10-26 ENCOUNTER — Encounter: Admit: 2017-10-26 | Discharge: 2017-11-24 | Payer: PRIVATE HEALTH INSURANCE

## 2017-10-26 DIAGNOSIS — R1033 Periumbilical pain: Principal | ICD-10-CM

## 2017-10-26 DIAGNOSIS — I25118 Atherosclerotic heart disease of native coronary artery with other forms of angina pectoris: Principal | ICD-10-CM

## 2017-10-26 DIAGNOSIS — R0600 Dyspnea, unspecified: Secondary | ICD-10-CM

## 2017-10-26 DIAGNOSIS — R079 Chest pain, unspecified: Principal | ICD-10-CM

## 2017-11-16 MED ORDER — OMEPRAZOLE 20 MG CAPSULE,DELAYED RELEASE
ORAL_CAPSULE | 1 refills | 0 days | Status: CP
Start: 2017-11-16 — End: 2018-05-04

## 2017-11-22 ENCOUNTER — Encounter: Admit: 2017-11-22 | Discharge: 2017-11-23 | Payer: PRIVATE HEALTH INSURANCE | Attending: Urology | Primary: Urology

## 2017-11-22 DIAGNOSIS — R319 Hematuria, unspecified: Principal | ICD-10-CM

## 2017-11-30 ENCOUNTER — Encounter
Admit: 2017-11-30 | Discharge: 2017-12-01 | Payer: PRIVATE HEALTH INSURANCE | Attending: Family Medicine | Primary: Family Medicine

## 2017-11-30 DIAGNOSIS — R7989 Other specified abnormal findings of blood chemistry: Secondary | ICD-10-CM

## 2017-11-30 DIAGNOSIS — R319 Hematuria, unspecified: Principal | ICD-10-CM

## 2017-11-30 DIAGNOSIS — I2510 Atherosclerotic heart disease of native coronary artery without angina pectoris: Secondary | ICD-10-CM

## 2017-11-30 DIAGNOSIS — R1310 Dysphagia, unspecified: Secondary | ICD-10-CM

## 2017-11-30 DIAGNOSIS — F17200 Nicotine dependence, unspecified, uncomplicated: Secondary | ICD-10-CM

## 2017-11-30 DIAGNOSIS — D649 Anemia, unspecified: Secondary | ICD-10-CM

## 2017-12-01 ENCOUNTER — Ambulatory Visit: Admit: 2017-12-01 | Discharge: 2017-12-14 | Payer: PRIVATE HEALTH INSURANCE

## 2017-12-01 DIAGNOSIS — R1033 Periumbilical pain: Secondary | ICD-10-CM

## 2017-12-01 DIAGNOSIS — R0600 Dyspnea, unspecified: Secondary | ICD-10-CM

## 2017-12-01 DIAGNOSIS — R079 Chest pain, unspecified: Principal | ICD-10-CM

## 2018-01-06 ENCOUNTER — Encounter
Admit: 2018-01-06 | Discharge: 2018-01-07 | Payer: PRIVATE HEALTH INSURANCE | Attending: Gastroenterology | Primary: Gastroenterology

## 2018-01-06 ENCOUNTER — Encounter: Admit: 2018-01-06 | Discharge: 2018-01-07 | Payer: PRIVATE HEALTH INSURANCE

## 2018-01-06 DIAGNOSIS — K5900 Constipation, unspecified: Principal | ICD-10-CM

## 2018-01-31 DIAGNOSIS — R1310 Dysphagia, unspecified: Principal | ICD-10-CM

## 2018-01-31 DIAGNOSIS — K222 Esophageal obstruction: Secondary | ICD-10-CM

## 2018-02-01 ENCOUNTER — Encounter: Admit: 2018-02-01 | Discharge: 2018-02-01 | Payer: PRIVATE HEALTH INSURANCE

## 2018-02-01 DIAGNOSIS — K222 Esophageal obstruction: Secondary | ICD-10-CM

## 2018-02-01 DIAGNOSIS — R1310 Dysphagia, unspecified: Principal | ICD-10-CM

## 2018-02-08 ENCOUNTER — Encounter
Admit: 2018-02-08 | Discharge: 2018-02-08 | Disposition: A | Discharge status: Home / Self Care | Payer: PRIVATE HEALTH INSURANCE

## 2018-02-08 DIAGNOSIS — R10811 Right upper quadrant abdominal tenderness: Secondary | ICD-10-CM

## 2018-02-08 DIAGNOSIS — R079 Chest pain, unspecified: Principal | ICD-10-CM

## 2018-02-08 MED ORDER — LIDOCAINE HCL 2 % MUCOSAL SOLUTION
OROMUCOSAL | 0 refills | 0.00000 days | Status: CP | PRN
Start: 2018-02-08 — End: 2018-02-10

## 2018-02-17 ENCOUNTER — Encounter
Admit: 2018-02-17 | Discharge: 2018-02-18 | Payer: PRIVATE HEALTH INSURANCE | Attending: Adult Health | Primary: Adult Health

## 2018-02-17 DIAGNOSIS — E782 Mixed hyperlipidemia: Secondary | ICD-10-CM

## 2018-02-17 DIAGNOSIS — I2510 Atherosclerotic heart disease of native coronary artery without angina pectoris: Principal | ICD-10-CM

## 2018-02-17 DIAGNOSIS — I10 Essential (primary) hypertension: Secondary | ICD-10-CM

## 2018-02-17 DIAGNOSIS — F17200 Nicotine dependence, unspecified, uncomplicated: Secondary | ICD-10-CM

## 2018-02-17 DIAGNOSIS — R1310 Dysphagia, unspecified: Secondary | ICD-10-CM

## 2018-03-03 MED ORDER — LISINOPRIL 10 MG TABLET
1 refills | 0 days | Status: CP
Start: 2018-03-03 — End: 2018-08-12

## 2018-03-08 MED ORDER — ASPIRIN 81 MG TABLET,DELAYED RELEASE
1 refills | 0 days | Status: CP
Start: 2018-03-08 — End: 2018-08-12

## 2018-05-04 MED ORDER — OMEPRAZOLE 20 MG CAPSULE,DELAYED RELEASE
ORAL_CAPSULE | 1 refills | 0 days | Status: CP
Start: 2018-05-04 — End: 2019-02-07

## 2018-06-07 ENCOUNTER — Other Ambulatory Visit: Payer: Self-pay | Admitting: Family Medicine

## 2018-07-12 MED ORDER — ATORVASTATIN 80 MG TABLET
1 refills | 0 days | Status: CP
Start: 2018-07-12 — End: 2019-02-07

## 2018-08-04 ENCOUNTER — Other Ambulatory Visit: Payer: Self-pay

## 2018-08-04 ENCOUNTER — Encounter: Payer: Self-pay | Admitting: Emergency Medicine

## 2018-08-04 ENCOUNTER — Ambulatory Visit
Admission: EM | Admit: 2018-08-04 | Discharge: 2018-08-04 | Disposition: A | Discharge status: Home / Self Care | Payer: BLUE CROSS/BLUE SHIELD | Attending: Family Medicine | Admitting: Family Medicine

## 2018-08-04 DIAGNOSIS — B349 Viral infection, unspecified: Secondary | ICD-10-CM | POA: Diagnosis not present

## 2018-08-04 NOTE — ED Triage Notes (Signed)
Patient c/o nausea and vomiting that started yesterday. Patient states he was unable to go to work last night.

## 2018-08-04 NOTE — ED Provider Notes (Signed)
[facility]    CSN: [id number] Arrival date & time: 08/04/18  0901  History   Chief Complaint Chief Complaint  Patient presents with  . Nausea  . Emesis   HPI  53 year old male presents with nausea, sore throat.  Patient reports that his symptoms started 3 days ago.  Reports sore throat, nausea, body aches.  No documented fever.  He has had some vomiting.  Patient states that he uses Zofran regularly due to frequent nausea.  He states that this is a chronic problem.  Patient states that he missed work yesterday/last night.  He is in need of work note.  Reports no abdominal pain.  No other associated symptoms.  He has been using Chloraseptic spray for his throat without resolution.  No known exacerbating factors.  No other complaints.  PMH, Surgical Hx, Family Hx, Social History reviewed and updated as below.  Past Medical History:  Diagnosis Date  . ADD (attention deficit disorder)   . COPD (chronic obstructive pulmonary disease) (HCC)   . Migraines   . Opiate addiction [facility])     Patient Active Problem List   Diagnosis Date Noted  . Essential (primary) hypertension 10/04/2014  . H/O renal calculi 10/04/2014  . Tobacco abuse, in remission 10/04/2014  . Colitis 10/04/2014  . Chronic obstructive pulmonary emphysema (HCC) 10/04/2014    Past Surgical History:  Procedure Laterality Date  . CORONARY ANGIOPLASTY WITH STENT PLACEMENT    . NO PAST SURGERIES     Home Medications    Prior to Admission medications   Medication Sig Start Date End Date Taking? Authorizing Provider  albuterol (PROVENTIL HFA;VENTOLIN HFA) 108 (90 BASE) MCG/ACT inhaler Inhale 2 puffs into the lungs 4 (four) times daily. 06/11/14  Yes [provider]  amphetamine-dextroamphetamine (ADDERALL XR) 30 MG 24 hr capsule Take 60 mg by mouth daily.   Yes [provider]  amphetamine-dextroamphetamine (ADDERALL) 20 MG tablet Take 20 mg by mouth daily.   Yes [provider]    aspirin 81 MG chewable tablet Chew by mouth daily.   Yes [provider]  atorvastatin (LIPITOR) 80 MG tablet Take 80 mg by mouth daily.   Yes [provider]  budesonide-formoterol (SYMBICORT) 160-4.5 MCG/ACT inhaler Inhale 2 puffs into the lungs 2 (two) times daily.   Yes [provider]  buprenorphine-naloxone (SUBOXONE) 8-2 MG SUBL SL tablet Place 2.5 tablets under the tongue daily.   Yes [provider]  ezetimibe-simvastatin (VYTORIN) 10-10 MG tablet Take 1 tablet by mouth at bedtime.   Yes [provider]  lisinopril (PRINIVIL,ZESTRIL) 10 MG tablet Take 1 tablet by mouth daily. 06/11/14  Yes [provider]  omeprazole (PRILOSEC) 40 MG capsule Take 1 capsule by mouth daily. 06/11/14  Yes [provider]  ondansetron (ZOFRAN) 4 MG tablet Take 4 mg by mouth every 8 (eight) hours as needed for nausea or vomiting.   Yes [provider]  prasugrel (EFFIENT) 10 MG TABS tablet Take 10 mg by mouth daily.   Yes [provider]  QUEtiapine (SEROQUEL) 50 MG tablet Take 50 mg by mouth at bedtime.   Yes [provider]    Family History History reviewed. No pertinent family history.  Social History Social History   Tobacco Use  . Smoking status: Current Every Day Smoker    Packs/day: 0.50    Types: Cigarettes  . Smokeless tobacco: Never Used  Substance Use Topics  . Alcohol use: No    Alcohol/week: 0.0 standard  drinks  . Drug use: No     Allergies   Gemfibrozil; Metoprolol; Statins; and Lexapro [escitalopram oxalate]   Review of Systems Review of Systems  Constitutional: Negative for fever.  HENT: Positive for sore throat.   Gastrointestinal: Positive for nausea and vomiting.   Physical Exam Triage Vital Signs ED Triage Vitals  Enc Vitals Group     BP 08/04/18 0924 133/87     Pulse Rate 08/04/18 0924 75     Resp 08/04/18 0924 18     Temp 08/04/18 0924 98.2 F (36.8 C)     Temp Source  08/04/18 0924 Oral     SpO2 08/04/18 0924 99 %     Weight 08/04/18 0922 168 lb (76.2 kg)     Height 08/04/18 0922 6' (1.829 m)     Head Circumference --      Peak Flow --      Pain Score 08/04/18 0921 4     Pain Loc --      Pain Edu? --      Excl. in GC? --    Updated Vital Signs BP 133/87 (BP Location: Right Arm)   Pulse 75   Temp 98.2 F (36.8 C) (Oral)   Resp 18   Ht 6' (1.829 m)   Wt 76.2 kg   SpO2 99%   BMI 22.78 kg/m   Visual Acuity Right Eye Distance:   Left Eye Distance:   Bilateral Distance:    Right Eye Near:   Left Eye Near:    Bilateral Near:     Physical Exam Vitals signs and nursing note reviewed.  Constitutional:      General: He is not in acute distress.    Appearance: Normal appearance.  HENT:     Head: Normocephalic and atraumatic.     Mouth/Throat:     Pharynx: Oropharynx is clear. No oropharyngeal exudate or posterior oropharyngeal erythema.  Eyes:     General:        Right eye: No discharge.        Left eye: No discharge.     Conjunctiva/sclera: Conjunctivae normal.  Cardiovascular:     Rate and Rhythm: Normal rate and regular rhythm.  Pulmonary:     Effort: Pulmonary effort is normal.     Breath sounds: Normal breath sounds.  Neurological:     Mental Status: He is alert.  Psychiatric:        Mood and Affect: Mood normal.        Behavior: Behavior normal.    UC Treatments / Results  Labs (all labs ordered are listed, but only abnormal results are displayed) Labs Reviewed - No data to display  EKG None  Radiology No results found.  Procedures Procedures (including critical care time)  Medications Ordered in UC Medications - No data to display  Initial Impression / Assessment and Plan / UC Course  I have reviewed the triage vital signs and the nursing notes.  Pertinent labs & imaging results that were available during my care of the patient were reviewed by me and considered in my medical decision making (see chart for  details).    53 year old male presents with a viral illness.  Advised rest and fluids.  Zofran as needed.  Patient has this medication at home.  Work note given.  Final Clinical Impressions(s) / UC Diagnoses   Final diagnoses:  Viral illness     Discharge Instructions     Rest. Fluids.  Zofran if needed.  Take care  Dr. Adriana Simas    ED Prescriptions    None     Controlled Substance Prescriptions [place] Controlled Substance Registry consulted? Not Applicable   Tommie Sams, DO 08/04/18 1028

## 2018-08-04 NOTE — Discharge Instructions (Signed)
Rest. Fluids. ° °Zofran if needed. ° °Take care ° °Dr. Malala Trenkamp  °

## 2018-08-12 DIAGNOSIS — E782 Mixed hyperlipidemia: Principal | ICD-10-CM

## 2018-08-12 DIAGNOSIS — I10 Essential (primary) hypertension: Principal | ICD-10-CM

## 2018-08-12 DIAGNOSIS — J449 Chronic obstructive pulmonary disease, unspecified: Principal | ICD-10-CM

## 2018-08-12 MED ORDER — PRASUGREL 10 MG TABLET
ORAL_TABLET | Freq: Every day | ORAL | 0 refills | 0 days | Status: CP
Start: 2018-08-12 — End: 2018-11-24

## 2018-08-12 MED ORDER — ASPIRIN 81 MG TABLET,DELAYED RELEASE
Freq: Every day | ORAL | 1 refills | 0 days | Status: CP
Start: 2018-08-12 — End: ?

## 2018-08-12 MED ORDER — BUDESONIDE-FORMOTEROL HFA 160 MCG-4.5 MCG/ACTUATION AEROSOL INHALER
Freq: Two times a day (BID) | RESPIRATORY_TRACT | 3 refills | 0.00000 days | Status: CP
Start: 2018-08-12 — End: 2019-02-08

## 2018-08-12 MED ORDER — LISINOPRIL 10 MG TABLET
Freq: Every day | ORAL | 1 refills | 0 days | Status: CP
Start: 2018-08-12 — End: 2018-12-15

## 2018-08-12 MED ORDER — ALBUTEROL SULFATE HFA 90 MCG/ACTUATION AEROSOL INHALER
Freq: Four times a day (QID) | RESPIRATORY_TRACT | 2 refills | 0.00000 days | Status: CP | PRN
Start: 2018-08-12 — End: 2019-02-08

## 2018-09-28 ENCOUNTER — Ambulatory Visit
Admit: 2018-09-28 | Discharge: 2018-09-29 | Payer: PRIVATE HEALTH INSURANCE | Attending: Family Medicine | Primary: Family Medicine

## 2018-09-28 DIAGNOSIS — I2510 Atherosclerotic heart disease of native coronary artery without angina pectoris: Principal | ICD-10-CM

## 2018-09-28 DIAGNOSIS — Z720 Tobacco use: Secondary | ICD-10-CM

## 2018-09-28 DIAGNOSIS — F321 Major depressive disorder, single episode, moderate: Secondary | ICD-10-CM

## 2018-09-28 DIAGNOSIS — J42 Unspecified chronic bronchitis: Secondary | ICD-10-CM

## 2018-09-28 DIAGNOSIS — E782 Mixed hyperlipidemia: Secondary | ICD-10-CM

## 2018-09-28 DIAGNOSIS — D649 Anemia, unspecified: Secondary | ICD-10-CM

## 2018-09-28 DIAGNOSIS — R7303 Prediabetes: Secondary | ICD-10-CM

## 2018-09-28 DIAGNOSIS — I10 Essential (primary) hypertension: Secondary | ICD-10-CM

## 2018-09-28 MED ORDER — SPIRIVA WITH HANDIHALER 18 MCG AND INHALATION CAPSULES
ORAL_CAPSULE | Freq: Every day | RESPIRATORY_TRACT | 2 refills | 0 days | Status: CP
Start: 2018-09-28 — End: 2019-01-10

## 2018-11-24 ENCOUNTER — Encounter
Admit: 2018-11-24 | Discharge: 2018-11-25 | Payer: PRIVATE HEALTH INSURANCE | Attending: Adult Health | Primary: Adult Health

## 2018-11-24 DIAGNOSIS — I10 Essential (primary) hypertension: Secondary | ICD-10-CM

## 2018-11-24 DIAGNOSIS — I2510 Atherosclerotic heart disease of native coronary artery without angina pectoris: Principal | ICD-10-CM

## 2018-11-24 DIAGNOSIS — R079 Chest pain, unspecified: Secondary | ICD-10-CM

## 2018-11-24 DIAGNOSIS — Z720 Tobacco use: Secondary | ICD-10-CM

## 2018-11-24 MED ORDER — EZETIMIBE 10 MG TABLET
ORAL_TABLET | Freq: Every day | ORAL | 3 refills | 0.00000 days | Status: CP
Start: 2018-11-24 — End: 2019-11-24

## 2018-11-24 MED ORDER — CARVEDILOL 12.5 MG TABLET
ORAL_TABLET | Freq: Two times a day (BID) | ORAL | 3 refills | 0 days | Status: CP
Start: 2018-11-24 — End: ?

## 2018-12-15 MED ORDER — LISINOPRIL 20 MG TABLET
ORAL_TABLET | Freq: Every day | ORAL | 3 refills | 0 days | Status: CP
Start: 2018-12-15 — End: ?

## 2019-01-09 ENCOUNTER — Encounter: Admit: 2019-01-09 | Discharge: 2019-01-10 | Payer: PRIVATE HEALTH INSURANCE | Attending: Family | Primary: Family

## 2019-01-09 DIAGNOSIS — Z20828 Contact with and (suspected) exposure to other viral communicable diseases: Principal | ICD-10-CM

## 2019-01-10 MED ORDER — SPIRIVA WITH HANDIHALER 18 MCG AND INHALATION CAPSULES
10 refills | 0 days | Status: CP
Start: 2019-01-10 — End: ?

## 2019-02-07 MED ORDER — ATORVASTATIN 80 MG TABLET
1 refills | 0 days | Status: CP
Start: 2019-02-07 — End: ?

## 2019-02-07 MED ORDER — OMEPRAZOLE 20 MG CAPSULE,DELAYED RELEASE
ORAL_CAPSULE | 1 refills | 0 days | Status: CP
Start: 2019-02-07 — End: ?

## 2019-02-18 ENCOUNTER — Encounter
Admit: 2019-02-18 | Discharge: 2019-02-19 | Disposition: A | Discharge status: Home / Self Care | Payer: BLUE CROSS/BLUE SHIELD | Attending: Emergency Medicine

## 2019-02-18 ENCOUNTER — Emergency Department
Admit: 2019-02-18 | Discharge: 2019-02-19 | Disposition: A | Discharge status: Home / Self Care | Payer: PRIVATE HEALTH INSURANCE | Attending: Emergency Medicine

## 2019-02-18 DIAGNOSIS — R079 Chest pain, unspecified: Secondary | ICD-10-CM

## 2019-02-18 DIAGNOSIS — R05 Cough: Secondary | ICD-10-CM

## 2019-02-18 DIAGNOSIS — Z79899 Other long term (current) drug therapy: Secondary | ICD-10-CM

## 2019-02-18 DIAGNOSIS — I10 Essential (primary) hypertension: Secondary | ICD-10-CM

## 2019-02-18 DIAGNOSIS — J069 Acute upper respiratory infection, unspecified: Secondary | ICD-10-CM

## 2019-02-18 DIAGNOSIS — Z20828 Contact with and (suspected) exposure to other viral communicable diseases: Secondary | ICD-10-CM

## 2019-02-18 DIAGNOSIS — J449 Chronic obstructive pulmonary disease, unspecified: Secondary | ICD-10-CM

## 2019-02-18 DIAGNOSIS — I2510 Atherosclerotic heart disease of native coronary artery without angina pectoris: Secondary | ICD-10-CM

## 2019-02-18 DIAGNOSIS — F17210 Nicotine dependence, cigarettes, uncomplicated: Secondary | ICD-10-CM

## 2019-02-18 DIAGNOSIS — Z7982 Long term (current) use of aspirin: Secondary | ICD-10-CM

## 2019-02-18 DIAGNOSIS — E785 Hyperlipidemia, unspecified: Secondary | ICD-10-CM

## 2019-02-18 DIAGNOSIS — Z888 Allergy status to other drugs, medicaments and biological substances status: Secondary | ICD-10-CM

## 2019-02-18 MED ORDER — AZITHROMYCIN 250 MG TABLET
ORAL_TABLET | Freq: Every day | ORAL | 0 refills | 6 days | Status: CP
Start: 2019-02-18 — End: ?

## 2019-03-29 ENCOUNTER — Encounter
Admit: 2019-03-29 | Discharge: 2019-03-30 | Payer: PRIVATE HEALTH INSURANCE | Attending: Family Medicine | Primary: Family Medicine

## 2019-03-29 DIAGNOSIS — E782 Mixed hyperlipidemia: Principal | ICD-10-CM

## 2019-03-29 DIAGNOSIS — I2510 Atherosclerotic heart disease of native coronary artery without angina pectoris: Principal | ICD-10-CM

## 2019-03-29 DIAGNOSIS — I25118 Atherosclerotic heart disease of native coronary artery with other forms of angina pectoris: Principal | ICD-10-CM

## 2019-03-29 DIAGNOSIS — Z720 Tobacco use: Principal | ICD-10-CM

## 2019-03-29 DIAGNOSIS — I10 Essential (primary) hypertension: Principal | ICD-10-CM

## 2019-03-29 MED ORDER — LISINOPRIL 40 MG TABLET: 40 mg | each | Freq: Every day | 3 refills | 90 days | Status: AC

## 2019-05-12 ENCOUNTER — Ambulatory Visit
Admit: 2019-05-12 | Discharge: 2019-05-13 | Payer: PRIVATE HEALTH INSURANCE | Attending: Family Medicine | Primary: Family Medicine

## 2019-05-12 ENCOUNTER — Encounter: Admit: 2019-05-12 | Discharge: 2019-05-13 | Payer: PRIVATE HEALTH INSURANCE

## 2019-05-12 ENCOUNTER — Ambulatory Visit: Admit: 2019-05-12 | Discharge: 2019-05-13 | Payer: PRIVATE HEALTH INSURANCE

## 2019-05-12 DIAGNOSIS — I10 Essential (primary) hypertension: Principal | ICD-10-CM

## 2019-05-12 DIAGNOSIS — R0789 Other chest pain: Principal | ICD-10-CM

## 2019-05-12 DIAGNOSIS — Z720 Tobacco use: Principal | ICD-10-CM

## 2019-05-12 DIAGNOSIS — F1910 Other psychoactive substance abuse, uncomplicated: Principal | ICD-10-CM

## 2019-05-13 MED ORDER — NITROGLYCERIN 0.4 MG SUBLINGUAL TABLET
ORAL_TABLET | SUBLINGUAL | 0 refills | 1.00000 days | Status: CP | PRN
Start: 2019-05-13 — End: 2020-05-12

## 2019-05-13 MED ORDER — BUDESONIDE-FORMOTEROL HFA 160 MCG-4.5 MCG/ACTUATION AEROSOL INHALER
Freq: Two times a day (BID) | RESPIRATORY_TRACT | 11 refills | 15 days | Status: CP
Start: 2019-05-13 — End: 2019-11-13

## 2019-05-13 MED ORDER — ALBUTEROL SULFATE HFA 90 MCG/ACTUATION AEROSOL INHALER
Freq: Four times a day (QID) | RESPIRATORY_TRACT | 2 refills | 0.00000 days | Status: CP | PRN
Start: 2019-05-13 — End: 2019-11-09

## 2019-05-26 ENCOUNTER — Encounter: Admit: 2019-05-26 | Discharge: 2019-06-24 | Payer: PRIVATE HEALTH INSURANCE

## 2019-05-26 ENCOUNTER — Encounter: Admit: 2019-05-26 | Discharge: 2019-06-08 | Payer: PRIVATE HEALTH INSURANCE

## 2019-06-21 DIAGNOSIS — R1314 Dysphagia, pharyngoesophageal phase: Principal | ICD-10-CM

## 2019-06-21 DIAGNOSIS — R1319 Other dysphagia: Principal | ICD-10-CM

## 2019-06-21 DIAGNOSIS — E782 Mixed hyperlipidemia: Principal | ICD-10-CM

## 2019-06-23 ENCOUNTER — Encounter
Admit: 2019-06-23 | Discharge: 2019-06-24 | Payer: PRIVATE HEALTH INSURANCE | Attending: Family Medicine | Primary: Family Medicine

## 2019-06-23 DIAGNOSIS — F1910 Other psychoactive substance abuse, uncomplicated: Principal | ICD-10-CM

## 2019-06-23 DIAGNOSIS — I2510 Atherosclerotic heart disease of native coronary artery without angina pectoris: Principal | ICD-10-CM

## 2019-06-23 DIAGNOSIS — Z720 Tobacco use: Principal | ICD-10-CM

## 2019-06-23 DIAGNOSIS — F321 Major depressive disorder, single episode, moderate: Principal | ICD-10-CM

## 2019-06-23 DIAGNOSIS — J42 Unspecified chronic bronchitis: Principal | ICD-10-CM

## 2019-06-23 MED ORDER — PREDNISONE 10 MG TABLET
ORAL_TABLET | Freq: Every day | ORAL | 0 refills | 10.00000 days | Status: CP
Start: 2019-06-23 — End: 2019-07-03

## 2019-06-23 MED ORDER — NICOTINE 14 MG/24 HR DAILY TRANSDERMAL PATCH
MEDICATED_PATCH | TRANSDERMAL | 0 refills | 28 days | Status: CP
Start: 2019-06-23 — End: ?

## 2019-06-23 MED ORDER — BUPROPION HCL SR 150 MG TABLET,12 HR SUSTAINED-RELEASE
ORAL_TABLET | Freq: Two times a day (BID) | ORAL | 2 refills | 30 days | Status: CP
Start: 2019-06-23 — End: 2020-06-22

## 2019-07-26 MED ORDER — IPRATROPIUM 0.5 MG-ALBUTEROL 3 MG (2.5 MG BASE)/3 ML NEBULIZATION SOLN
6 refills | 0 days | Status: CP
Start: 2019-07-26 — End: ?

## 2019-07-31 DIAGNOSIS — E782 Mixed hyperlipidemia: Principal | ICD-10-CM

## 2019-07-31 DIAGNOSIS — R1314 Dysphagia, pharyngoesophageal phase: Principal | ICD-10-CM

## 2019-07-31 DIAGNOSIS — R1319 Other dysphagia: Principal | ICD-10-CM

## 2019-07-31 MED ORDER — ATORVASTATIN 80 MG TABLET
ORAL_TABLET | 0 refills | 0 days | Status: CP
Start: 2019-07-31 — End: ?

## 2019-07-31 MED ORDER — OMEPRAZOLE 20 MG CAPSULE,DELAYED RELEASE
ORAL_CAPSULE | 1 refills | 0 days | Status: CP
Start: 2019-07-31 — End: ?

## 2019-08-09 ENCOUNTER — Ambulatory Visit: Admit: 2019-08-09 | Discharge: 2019-08-10 | Attending: Family Medicine | Primary: Family Medicine

## 2019-08-09 DIAGNOSIS — I2510 Atherosclerotic heart disease of native coronary artery without angina pectoris: Principal | ICD-10-CM

## 2019-08-09 DIAGNOSIS — Z720 Tobacco use: Principal | ICD-10-CM

## 2019-08-09 DIAGNOSIS — J42 Unspecified chronic bronchitis: Principal | ICD-10-CM

## 2019-08-09 DIAGNOSIS — J449 Chronic obstructive pulmonary disease, unspecified: Principal | ICD-10-CM

## 2019-08-16 ENCOUNTER — Encounter: Admit: 2019-08-16 | Discharge: 2019-08-17 | Payer: PRIVATE HEALTH INSURANCE

## 2019-08-16 DIAGNOSIS — J42 Unspecified chronic bronchitis: Principal | ICD-10-CM

## 2019-08-16 MED ORDER — TRELEGY ELLIPTA 100 MCG-62.5 MCG-25 MCG POWDER FOR INHALATION
Freq: Every day | RESPIRATORY_TRACT | 3 refills | 1.00000 days | Status: CP
Start: 2019-08-16 — End: ?

## 2019-08-19 ENCOUNTER — Encounter: Admit: 2019-08-19 | Discharge: 2019-08-20 | Payer: PRIVATE HEALTH INSURANCE

## 2019-09-11 DIAGNOSIS — E782 Mixed hyperlipidemia: Principal | ICD-10-CM

## 2019-09-11 MED ORDER — ATORVASTATIN 80 MG TABLET
ORAL_TABLET | 0 refills | 0 days | Status: CP
Start: 2019-09-11 — End: ?

## 2019-09-11 MED ORDER — EZETIMIBE 10 MG TABLET
ORAL_TABLET | 0 refills | 0 days | Status: CP
Start: 2019-09-11 — End: ?

## 2019-09-13 MED ORDER — NITROGLYCERIN 0.4 MG SUBLINGUAL TABLET
0 refills | 0 days | Status: CP
Start: 2019-09-13 — End: ?

## 2020-10-10 ENCOUNTER — Emergency Department: Admit: 2020-10-10 | Discharge: 2020-10-10 | Payer: PRIVATE HEALTH INSURANCE

## 2020-10-10 ENCOUNTER — Ambulatory Visit: Admit: 2020-10-10 | Discharge: 2020-10-10 | Payer: PRIVATE HEALTH INSURANCE

## 2020-10-30 DIAGNOSIS — M9905 Segmental and somatic dysfunction of pelvic region: Principal | ICD-10-CM

## 2020-12-20 ENCOUNTER — Ambulatory Visit: Admit: 2020-12-20 | Payer: PRIVATE HEALTH INSURANCE

## 2021-03-19 ENCOUNTER — Ambulatory Visit: Admit: 2021-03-19 | Discharge: 2021-03-20

## 2021-03-19 DIAGNOSIS — R053 Chronic cough: Principal | ICD-10-CM

## 2021-03-28 DIAGNOSIS — J449 Chronic obstructive pulmonary disease, unspecified: Principal | ICD-10-CM

## 2021-04-03 ENCOUNTER — Ambulatory Visit
Admit: 2021-04-03 | Discharge: 2021-04-04 | Payer: PRIVATE HEALTH INSURANCE | Attending: Adult Health | Primary: Adult Health

## 2021-04-03 DIAGNOSIS — E782 Mixed hyperlipidemia: Principal | ICD-10-CM

## 2021-04-03 DIAGNOSIS — I25119 Atherosclerotic heart disease of native coronary artery with unspecified angina pectoris: Principal | ICD-10-CM

## 2021-04-03 DIAGNOSIS — I10 Essential (primary) hypertension: Principal | ICD-10-CM

## 2021-04-03 DIAGNOSIS — Z720 Tobacco use: Principal | ICD-10-CM

## 2021-04-03 MED ORDER — LISINOPRIL 10 MG TABLET
ORAL_TABLET | Freq: Every day | ORAL | 6 refills | 30 days | Status: CP
Start: 2021-04-03 — End: 2021-05-03

## 2021-04-07 DIAGNOSIS — I209 Angina pectoris, unspecified: Principal | ICD-10-CM

## 2021-04-07 DIAGNOSIS — I2510 Atherosclerotic heart disease of native coronary artery without angina pectoris: Principal | ICD-10-CM

## 2021-06-18 ENCOUNTER — Ambulatory Visit: Admit: 2021-06-18 | Payer: PRIVATE HEALTH INSURANCE

## 2021-06-26 ENCOUNTER — Ambulatory Visit
Admit: 2021-06-26 | Discharge: 2021-06-27 | Payer: PRIVATE HEALTH INSURANCE | Attending: Adult Health | Primary: Adult Health

## 2021-06-26 DIAGNOSIS — E782 Mixed hyperlipidemia: Principal | ICD-10-CM

## 2021-06-26 DIAGNOSIS — R0609 Other forms of dyspnea: Principal | ICD-10-CM

## 2021-06-26 DIAGNOSIS — Z720 Tobacco use: Principal | ICD-10-CM

## 2021-06-26 MED ORDER — NICOTINE 14 MG/24 HR DAILY TRANSDERMAL PATCH
MEDICATED_PATCH | TRANSDERMAL | 2 refills | 28 days | Status: CP
Start: 2021-06-26 — End: ?

## 2021-06-26 MED ORDER — PRASUGREL 10 MG TABLET
ORAL_TABLET | Freq: Every day | ORAL | 3 refills | 90 days | Status: CP
Start: 2021-06-26 — End: ?

## 2021-07-03 DIAGNOSIS — J449 Chronic obstructive pulmonary disease, unspecified: Principal | ICD-10-CM

## 2021-07-03 DIAGNOSIS — R053 Chronic coughing: Principal | ICD-10-CM

## 2021-07-17 ENCOUNTER — Ambulatory Visit: Admit: 2021-07-17 | Discharge: 2021-07-18 | Payer: PRIVATE HEALTH INSURANCE

## 2021-07-17 DIAGNOSIS — Z006 Encounter for examination for normal comparison and control in clinical research program: Principal | ICD-10-CM

## 2021-07-31 ENCOUNTER — Ambulatory Visit: Admit: 2021-07-31 | Discharge: 2021-08-01 | Payer: PRIVATE HEALTH INSURANCE

## 2021-08-21 ENCOUNTER — Ambulatory Visit: Admit: 2021-08-21 | Discharge: 2021-08-22 | Payer: PRIVATE HEALTH INSURANCE

## 2021-08-25 ENCOUNTER — Ambulatory Visit: Admit: 2021-08-25 | Discharge: 2021-08-26 | Payer: PRIVATE HEALTH INSURANCE

## 2021-08-28 ENCOUNTER — Ambulatory Visit: Admit: 2021-08-28 | Discharge: 2021-08-29 | Payer: PRIVATE HEALTH INSURANCE

## 2021-08-28 DIAGNOSIS — Z720 Tobacco use: Principal | ICD-10-CM

## 2021-08-28 DIAGNOSIS — E785 Hyperlipidemia, unspecified: Principal | ICD-10-CM

## 2021-08-28 DIAGNOSIS — R0683 Snoring: Principal | ICD-10-CM

## 2021-08-28 DIAGNOSIS — I2510 Atherosclerotic heart disease of native coronary artery without angina pectoris: Principal | ICD-10-CM

## 2021-08-28 DIAGNOSIS — R053 Chronic cough: Principal | ICD-10-CM

## 2021-08-28 DIAGNOSIS — J449 Chronic obstructive pulmonary disease, unspecified: Principal | ICD-10-CM

## 2021-09-22 DIAGNOSIS — J42 Unspecified chronic bronchitis: Principal | ICD-10-CM

## 2021-09-26 DIAGNOSIS — J42 Unspecified chronic bronchitis: Principal | ICD-10-CM

## 2021-09-26 MED ORDER — TRELEGY ELLIPTA 100 MCG-62.5 MCG-25 MCG POWDER FOR INHALATION
Freq: Every day | RESPIRATORY_TRACT | 5 refills | 60 days | Status: CP
Start: 2021-09-26 — End: ?

## 2021-10-16 ENCOUNTER — Ambulatory Visit
Admit: 2021-10-16 | Discharge: 2021-10-17 | Payer: PRIVATE HEALTH INSURANCE | Attending: Adult Health | Primary: Adult Health

## 2021-10-16 DIAGNOSIS — E782 Mixed hyperlipidemia: Principal | ICD-10-CM

## 2021-10-16 DIAGNOSIS — D649 Anemia, unspecified: Principal | ICD-10-CM

## 2021-10-16 DIAGNOSIS — I2510 Atherosclerotic heart disease of native coronary artery without angina pectoris: Principal | ICD-10-CM

## 2021-10-16 DIAGNOSIS — I10 Essential (primary) hypertension: Principal | ICD-10-CM

## 2021-10-16 DIAGNOSIS — R5383 Other fatigue: Principal | ICD-10-CM

## 2021-10-16 DIAGNOSIS — D72829 Elevated white blood cell count, unspecified: Principal | ICD-10-CM

## 2021-10-16 MED ORDER — REPATHA SURECLICK 140 MG/ML SUBCUTANEOUS PEN INJECTOR
SUBCUTANEOUS | 5 refills | 0 days | Status: CP
Start: 2021-10-16 — End: ?

## 2021-12-18 ENCOUNTER — Ambulatory Visit: Admit: 2021-12-18 | Discharge: 2021-12-19 | Payer: PRIVATE HEALTH INSURANCE

## 2021-12-25 ENCOUNTER — Ambulatory Visit: Admit: 2021-12-25 | Discharge: 2021-12-26 | Payer: PRIVATE HEALTH INSURANCE

## 2021-12-25 DIAGNOSIS — J42 Unspecified chronic bronchitis: Principal | ICD-10-CM

## 2021-12-25 DIAGNOSIS — R0602 Shortness of breath: Principal | ICD-10-CM

## 2021-12-25 DIAGNOSIS — J449 Chronic obstructive pulmonary disease, unspecified: Principal | ICD-10-CM

## 2022-01-19 ENCOUNTER — Ambulatory Visit
Admit: 2022-01-19 | Discharge: 2022-01-19 | Payer: PRIVATE HEALTH INSURANCE | Attending: Adult Health | Primary: Adult Health

## 2022-01-19 DIAGNOSIS — I10 Essential (primary) hypertension: Principal | ICD-10-CM

## 2022-01-19 DIAGNOSIS — Z720 Tobacco use: Principal | ICD-10-CM

## 2022-01-19 DIAGNOSIS — I2510 Atherosclerotic heart disease of native coronary artery without angina pectoris: Principal | ICD-10-CM

## 2022-01-19 DIAGNOSIS — R0609 Other forms of dyspnea: Principal | ICD-10-CM

## 2022-01-19 DIAGNOSIS — E782 Mixed hyperlipidemia: Principal | ICD-10-CM

## 2022-01-19 MED ORDER — EMPTY CONTAINER
2 refills | 0 days
Start: 2022-01-19 — End: ?

## 2022-01-19 NOTE — Unmapped (Signed)
[facility]   Patient Onboarding/Medication Counseling    Chase Mendoza is a 56 y.o. male with hyperlipidemia who I am counseling today on initiation of therapy.  I am speaking to the patient.    Was a Nurse, learning disability used for this call? No    Verified patient's date of birth / HIPAA.    Specialty medication(s) to be sent: General Specialty: Repatha      Non-specialty medications/supplies to be sent: Higher education careers adviser      Medications not needed at this time: n/a         Repatha (evolocumab)    Medication & Administration     Dosage: Inject the contents of 1 pen (140mg ) under the skin every 2 weeks.    Administration: Administer under the skin of the abdomen, thigh or upper arm. Rotate sites with each injection.    Injection instructions - Autoinjector:  Remove 1 Repatha autoinjector from the refrigerator and let stand at room temperature for at least 30 minutes.  Check the autoinjector for the following:  Expiration date  Absence of any cracks or damage  The medicine is clear and colorless and does not contain any particles  The orange cap is present and securely attached  Choose your injection site and clean with an alcohol wipe. Allow to air dry completely.  Pull the orange cap straight off and discard  Pinch the skin (or stretch) with your thumb and fingers creating an area 2 inches wide  Maintaining the pinch (or stretch) press the pen to your skin at a 90 degree angle. Firmly push the autoinjector down until the skin stops moving and the yellow safety guard is no longer visible.  Do not touch the gray start button yet  When you are ready to inject, press the gray start button. You will hear a click that signals the start of the injection  Continue to press the pen to your skin and lift your thumb  The injection may take up to 15 seconds. You will know the injection is complete when the medication window turns yellow. You may also hear a second click.  Remove the pen from your skin and discard the pen in a sharps container.  If there is blood at the injection site, press a cotton ball or gauze to the site. Do not rub the injection site.    Adherence/Missed dose instructions: Administer a missed dose within 7 days and resume your normal schedule.  If it has been more than 7 days and you inject every 2 weeks, skip the missed dose and resume your normal schedule..     Goals of Therapy     Lower cholesterol, prevention of cardiovascular events in patients with established cardiovascular disease    Side Effects & Monitoring Parameters   Flu-like symptoms  Signs of a common cold  Back pain  Injection site irritation  Nose or throat irritation    The following side effects should be reported to the provider:  Signs of an allergic reaction  Signs of high blood sugar (confusion, drowsiness, increase thirst/hunger/urination, fast breathing, flushing)      Contraindications, Warnings, & Precautions     Latex (the packaging of Repatha may contain natural rubber)    Drug/Food Interactions     Medication list reviewed in Epic. The patient was instructed to inform the care team before taking any new medications or supplements. No drug interactions identified.     Storage, Handling Precautions, & Disposal   Repatha  should be stored in the refrigerator. If necessary, Repatha may be kept at room temperature for no more than 30 days.  Place used devices in a sharps container for disposal.      Current Medications (including OTC/herbals), Comorbidities and Allergies     Current Outpatient Medications   Medication Sig Dispense Refill    albuterol HFA 90 mcg/actuation inhaler Inhale 2 puffs every six (6) hours as needed for wheezing. 8.5 g 2    aspirin (ECOTRIN) 81 MG tablet Take 1 tablet (81 mg total) by mouth daily. 90 each 1    atorvastatin (LIPITOR) 80 MG tablet TAKE ONE (1) TABLET BY MOUTH ONCE DAILY 90 tablet 0    carvediloL (COREG) 12.5 MG tablet Take 1 tablet (12.5 mg total) by mouth Two (2) times a day. 180 tablet 3    cetirizine (ZYRTEC) 10 MG tablet  (Patient not taking: Reported on 10/16/2021)      evolocumab (REPATHA SURECLICK) 140 mg/mL PnIj Inject the contents of one pen (140 mg) under the skin every fourteen (14) days. (Patient not taking: Reported on 01/19/2022) 2 mL 5    ezetimibe (ZETIA) 10 mg tablet TAKE ONE TABLET BY MOUTH DAILY. 90 tablet 0    fluticasone propionate (FLONASE) 50 mcg/actuation nasal spray       fluticasone-umeclidin-vilanter (TRELEGY ELLIPTA) 100-62.5-25 mcg inhaler Inhale 1 puff daily. 60 each 5    ipratropium-albuteroL (DUO-NEB) 0.5-2.5 mg/3 mL nebulizer USE 1 VIAL VIA NEBULIZER EVERY 6 HOURS AS NEEDED. 360 mL 6    lisinopriL (PRINIVIL,ZESTRIL) 10 MG tablet Take 1 tablet (10 mg total) by mouth daily. 30 tablet 6    nitroglycerin (NITROSTAT) 0.4 MG SL tablet PLACE 1 TABLET UNDER THE TONGUE EVERY 5 MINUTES AS NEEDED FOR CHEST PAIN. MAX OF3 DOSES IN 15 MINUTES. 25 each 0    omeprazole (PRILOSEC) 20 MG capsule TAKE (1) CAPSULE BY MOUTH TWICE DAILY BEFORE MEALS 180 capsule 1    ondansetron (ZOFRAN-ODT) 8 MG disintegrating tablet       prasugreL (EFFIENT) 10 mg tablet Take 1 tablet (10 mg total) by mouth daily. 90 tablet 3    QUEtiapine (SEROQUEL) 100 MG tablet       tamsulosin (FLOMAX) 0.4 mg capsule Take by mouth.       No current facility-administered medications for this visit.       Allergies   Allergen Reactions    Lopid [Gemfibrozil] Other (See Comments)     Lab values were abnormal    Metoprolol Headache     migraine    Statins-Hmg-Coa Reductase Inhibitors     Lexapro [Escitalopram Oxalate] Other (See Comments) and Anxiety     depression       Patient Active Problem List   Diagnosis    Dysphagia    GERD (gastroesophageal reflux disease)    Pulmonary emphysema (CMS-HCC)    Non-specific colitis    Essential hypertension    History of renal calculi    Tobacco use    Moderate single current episode of major depressive disorder (CMS-HCC)    Mixed simple and mucopurulent chronic bronchitis (CMS-HCC)    Fatigue    Chronic oral opiate use (suboxone)    Psychophysiological insomnia    Adenomatous polyp of colon    ADHD (attention deficit hyperactivity disorder)    Chronic obstructive pulmonary disease (CMS-HCC)    Abnormal TSH    Mixed hyperlipidemia    Normocytic anemia    Coronary artery disease involving native coronary artery of native heart  without angina pectoris    Encounter for annual physical exam    Sick-euthyroid syndrome    Kidney stone    Prediabetes    Generalized abdominal pain    Hematuria    Polysubstance abuse (CMS-HCC)    Health care maintenance       Reviewed and up to date in Epic.    Appropriateness of Therapy     Acute infections noted within Epic:  No active infections  Patient reported infection: None    Is medication and dose appropriate based on diagnosis and infection status? Yes    Prescription has been clinically reviewed: Yes      Baseline Quality of Life Assessment      How many days over the past month did your hyperlipidemia  keep you from your normal activities? For example, brushing your teeth or getting up in the morning. Patient declined to answer    Financial Information     Medication Assistance provided: Prior Authorization    Anticipated copay of $2.27 (28 days) reviewed with patient. Verified delivery address.    Delivery Information     Scheduled delivery date: 01/21/22    Expected start date: 01/21/22    Medication will be delivered via UPS to the prescription address in [facility].  This shipment will not require a signature.      Explained the services we provide at [facility] and that each month we would call to set up refills.  Stressed importance of returning phone calls so that we could ensure they receive their medications in time each month.  Informed patient that we should be setting up refills 7-10 days prior to when they will run out of medication.  A pharmacist will reach out to perform a clinical assessment periodically. Informed patient that a welcome packet, containing information about our pharmacy and other support services, a Notice of Privacy Practices, and a drug information handout will be sent.      The patient or caregiver noted above participated in the development of this care plan and knows that they can request review of or adjustments to the care plan at any time.      Patient or caregiver verbalized understanding of the above information as well as how to contact the pharmacy at [phone] option 4 with any questions/concerns.  The pharmacy is open Monday through Friday 8:30am-4:30pm.  A pharmacist is available 24/7 via pager to answer any clinical questions they may have.    Patient Specific Needs     Does the patient have any physical, cognitive, or cultural barriers? No    Does the patient have adequate living arrangements? (i.e. the ability to store and take their medication appropriately) Yes    Did you identify any home environmental safety or security hazards? No    Patient prefers to have medications discussed with  Patient     Is the patient or caregiver able to read and understand education materials at a high school level or above? Yes    Patient's primary language is  English     Is the patient high risk? No    SOCIAL DETERMINANTS OF HEALTH     At the [facility], we have learned that life circumstances - like trouble affording food, housing, utilities, or transportation can affect the health of many of our patients.   That is why we wanted to ask: are you currently experiencing any life circumstances that are negatively impacting your health and/or  quality of life? Patient declined to answer    Social Determinants of Health     Financial Resource Strain: Low Risk  (05/12/2019)    Overall Financial Resource Strain (CARDIA)     Difficulty of Paying Living Expenses: Not hard at all   Internet Connectivity: Not on file   Food Insecurity: Unknown (05/12/2019)    Hunger Vital Sign     Worried About Running Out of Food in the Last Year: Patient refused     Ran Out of Food in the Last Year: Patient refused   Tobacco Use: High Risk (01/19/2022)    Patient History     Smoking Tobacco Use: Every Day     Smokeless Tobacco Use: Current     Passive Exposure: Not on file   Housing/Utilities: Unknown (09/14/2020)    Housing/Utilities     Within the past 12 months, have you ever stayed: outside, in a car, in a tent, in an overnight shelter, or temporarily in someone else's home (i.e. couch-surfing)?: No     Are you worried about losing your housing?: Not on file     Within the past 12 months, have you been unable to get utilities (heat, electricity) when it was really needed?: Not on file   Alcohol Use: Not on file   Transportation Needs: Unknown (05/12/2019)    PRAPARE - Transportation     Lack of Transportation (Medical): Patient refused     Lack of Transportation (Non-Medical): Patient refused   Substance Use: Not on file   Health Literacy: Low Risk  (09/14/2020)    Health Literacy     : Never   Physical Activity: Not on file   Interpersonal Safety: Not on file   Stress: Not on file   Intimate Partner Violence: Not on file   Depression: Not on file   Social Connections: Not on file       Would you be willing to receive help with any of the needs that you have identified today? Not applicable       Camillo Flaming  [facility] Specialty Pharmacist

## 2022-01-19 NOTE — Unmapped (Signed)
CAD, HTN, HLD. No overt exertional angina but given his history, important to r/o HF as the cause of his fatigue symptoms.   Per Dr. Leretha Dykes note in [place] 2023.  Pt states he took one NTG SL with relief this past Friday, but continues to have no energy what so ever.  Stent placed at [facility] 2022.  Also complained of constipation.  I wrote down Miralax for him.

## 2022-01-19 NOTE — Unmapped (Signed)
DIVISION OF CARDIOLOGY   [employer], [place]                                                                         Date of Service:  01/19/2022     Assessment/Plan:   1. CAD  s/p PCI to RCA (03/2017) with 50% mLCX lesion which has been medically managed. Had progressive anginal sx and underwent PCI of ISR of mRCA stent at [facility] (due to insurance). LCx dz similar to prior.  Did not tolerate imdur (HA). No overt exertional angina - recent CP occurred I/s/o high BP, has used NTG a few times but appears stable. Recent echo w/ normal LV & RV function.   -ASA 81 mg daily  -prasugrel 10 mg daily, end date 04/14/2022   -carvedilol 12.5 mg BID  - lipid mgmt as per below    2. Essential hypertension  Well controlled     3. Tobacco abuse disorder  1/2 ppd. I counseled him on the importance of quitting smoking.      4. Mixed hyperlipidemia  atorvastatin 80, Zetia 10 mg.  Confirmed compliance. Has had prior LDL>200, c/f familial HLD  LDL above goal  Prior Rx Repatha but ?denied by insurance>>messaged [facility]SC for possible repeal    Lab Results   Component Value Date    LDL 84 06/26/2021    LDL 108.0 06/26/2021     5. DOE  Stable. Likely combination of moderate to severe COPD, cont'd smoking, deconditioning. Followed by pulmonology  Scheduled for sleep study    Return to clinic: Return in about 3 months (around 04/21/2022).    I personally spent 32 minutes face-to-face and non-face-to-face in the care of this patient, which includes all pre, intra, and post visit time on the date of service.      Subjective:   [id number] HLTH SVC PROSPECT H  Patient ID: Chase Mendoza is an 56 y.o. male patient with CAD s/p PCI of RCA (04/2021), HTN, HLD, tobacco use, COPD, GERD who presents for follow-up.    Interval History:  Patient was seen in clinic 08/2021 by Dr. Verdis Frederickson for fatigue and a lack of energy as well as non-exertional squeezing chest pain. Echo showed LVEF 55%.   Last seen 10/16/21 by myself. Cont'd fatigue>>extensive labs unremarkable other than ferritin 34. Repatha started as LDL not at goal on max statin/zetia.     Here for f/up. Had recent episode of high BP to 200 w/ associated chest discomfort. Took NTG w/ relief. No exertional angina like he previously had.   Has sleep study scheduled.   Continues to feel very fatigued. Stable DOE. Still smoking 1/2 ppd.   He is on inhalers, followed by pulmonology. Would be eligible for additional treatment if he stopped smoking  Home BP 120s/70-80s.  Continues to work at [employer] when he can  Mother and brother both died of MI.     Objective:   Physical Exam:  BP 118/67  - Pulse 82  - Temp 36.3 ??C (97.3 ??F)  - Resp 18  - Ht 185.4 cm (6' 1)  - Wt 87.9 kg (193 lb 12.8 oz)  - SpO2 96%  - BMI 25.57 kg/m??   General-  Normal appearing male in no apparent distress.  Neurologic- Alert and oriented X3.  Cranial nerve II-XII grossly intact.  HEENT-  Normocephalic atraumatic head.  No scleral icterus.  MMM  Neck- Supple, no carotid bruis, no JVD  Lungs- Clear to auscultation, no wheezes, rhonchi, or rhales.  Heart- RRR, nl S1S2, no MRG  Extremities-  No clubbing or cyanosis.  No LE edema  Pulses- +2 pulses in radial bilaterally.  Psych- Normal mood, appropriate.     Notable results:  Labs:   Lab Results   Component Value Date    WBC 10.9 10/16/2021    HGB 14.7 10/16/2021    HCT 44.3 10/16/2021    PLT 254 10/16/2021     Lab Results   Component Value Date    NA 143 10/16/2021    K 4.0 10/16/2021    CREATININE 0.87 10/16/2021      Lab Results   Component Value Date    CHOL 146 06/26/2021    LDL 84 06/26/2021    LDL 108.0 06/26/2021    HDL 27 (L) 06/26/2021    TRIG 177 (H) 06/26/2021     Lab Results   Component Value Date    A1C 5.7 (H) 05/13/2019      Imaging/Other:  Electrocardiogram:    From 10/16/21 showed NSR.      Echo:  From 05/29/17 showed normal left ventricular systolic function, ejection fraction > 55%. Normal right ventricular systolic function. No significant valvular abnormalities.    From 08/25/21 showed LVEF 55%. The right ventricle is not well visualized but probably normal in size, with normal systolic function.     Nuclear Stress Test:  From 10/2017 was a normal myocardial perfusion study. No evidence for significant ischemia or scar noted. Post-stress EF > 65%.     From 05/26/19 showed normal myocardial perfusion study. No evidence for significant ischemia or scar is noted. Post stress: Global systolic function is normal. The ejection fraction calculated at 60%. Attenuation CT scan shows post PCI findings. CT finding show emphysematous and non-specific ground glass changes of the lungs.     Cardiac Catheterization:  From 03/2017 showed normal LV systolic function. Coronary artery disease including very long 50% mid-RCA stenosis (FFR = 0.80) and 50% mid-circumflex stenosis. Successful PCI of mid-RCA with placement of an Onyx drug eluting stent.     From [facility] 04/14/21 showed 2 vessel coronary artery disease.   Successful PCI of mid RCA. LCx disease is similar to 2018   ISR of mid RCA was target for intervention. 2.25 x 12-mm PTCA balloon, HD IVUS used for optimization. Prior stents were expanded and apposed. Diffuse ISR. 3.0 x 28-mm Synergy DES at 16 atm. 3.0 x 20-mm  Emerge used to post-dilate.      Lipid panel:  Component      Latest Ref Rng 06/26/2021   Triglycerides      0 - 150 mg/dL 161 (H)    Cholesterol      <=200 mg/dL 096    HDL      40 - 60 mg/dL 27 (L)    LDL calculated      40 - 99 mg/dL 84    VLDL Cholesterol Cal      12 - 47 mg/dL 04.5    Chol/HDL Ratio      1.0 - 4.5  5.4 (H)    Non-HDL Cholesterol      70 - 409 mg/dL 811    FASTING Unknown  Glade Stanford, AGNP-C  Cardiology Nurse Practitioner  [facility]

## 2022-01-19 NOTE — Unmapped (Signed)
I [facility] about repatha

## 2022-01-20 MED FILL — REPATHA SURECLICK 140 MG/ML SUBCUTANEOUS PEN INJECTOR: SUBCUTANEOUS | 28 days supply | Qty: 2 | Fill #0

## 2022-01-20 MED FILL — EMPTY CONTAINER: 120 days supply | Qty: 1 | Fill #0

## 2022-01-21 DIAGNOSIS — F319 Bipolar disorder, unspecified: Principal | ICD-10-CM

## 2022-01-21 DIAGNOSIS — F900 Attention-deficit hyperactivity disorder, predominantly inattentive type: Principal | ICD-10-CM

## 2022-02-04 NOTE — Unmapped (Signed)
I reviewed this patient case and all documentation provided by the learner and was readily available for consultation during their interaction with the patient.  I agree with the assessment and plan listed below.    Tylek Boney    [facility] Specialty Pharmacist

## 2022-02-04 NOTE — Unmapped (Addendum)
[facility] Clinical Assessment & Refill Coordination Note    Chase Mendoza, DOB: 10-03-1965  Phone: [phone] (home)     All above HIPAA information was verified with patient.     Was a Nurse, learning disability used for this call? No    Specialty Medication(s):   General Specialty: Repatha     Current Outpatient Medications   Medication Sig Dispense Refill    albuterol HFA 90 mcg/actuation inhaler Inhale 2 puffs every six (6) hours as needed for wheezing. 8.5 g 2    aspirin (ECOTRIN) 81 MG tablet Take 1 tablet (81 mg total) by mouth daily. 90 each 1    atorvastatin (LIPITOR) 80 MG tablet TAKE ONE (1) TABLET BY MOUTH ONCE DAILY 90 tablet 0    carvediloL (COREG) 12.5 MG tablet Take 1 tablet (12.5 mg total) by mouth Two (2) times a day. 180 tablet 3    cetirizine (ZYRTEC) 10 MG tablet  (Patient not taking: Reported on 10/16/2021)      empty container (SHARPS-A-GATOR DISPOSAL SYSTEM) Misc Use as directed for sharps disposal 1 each 2    evolocumab (REPATHA SURECLICK) 140 mg/mL PnIj Inject the contents of one pen (140 mg) under the skin every fourteen (14) days. (Patient not taking: Reported on 01/19/2022) 2 mL 5    ezetimibe (ZETIA) 10 mg tablet TAKE ONE TABLET BY MOUTH DAILY. 90 tablet 0    fluticasone propionate (FLONASE) 50 mcg/actuation nasal spray       fluticasone-umeclidin-vilanter (TRELEGY ELLIPTA) 100-62.5-25 mcg inhaler Inhale 1 puff daily. 60 each 5    ipratropium-albuteroL (DUO-NEB) 0.5-2.5 mg/3 mL nebulizer USE 1 VIAL VIA NEBULIZER EVERY 6 HOURS AS NEEDED. 360 mL 6    lisinopriL (PRINIVIL,ZESTRIL) 10 MG tablet Take 1 tablet (10 mg total) by mouth daily. 30 tablet 6    nitroglycerin (NITROSTAT) 0.4 MG SL tablet PLACE 1 TABLET UNDER THE TONGUE EVERY 5 MINUTES AS NEEDED FOR CHEST PAIN. MAX OF3 DOSES IN 15 MINUTES. 25 each 0    omeprazole (PRILOSEC) 20 MG capsule TAKE (1) CAPSULE BY MOUTH TWICE DAILY BEFORE MEALS 180 capsule 1    ondansetron (ZOFRAN-ODT) 8 MG disintegrating tablet prasugreL (EFFIENT) 10 mg tablet Take 1 tablet (10 mg total) by mouth daily. 90 tablet 3    QUEtiapine (SEROQUEL) 100 MG tablet       tamsulosin (FLOMAX) 0.4 mg capsule Take by mouth.       No current facility-administered medications for this visit.        Changes to medications: Skyeler reports no changes at this time.    Allergies   Allergen Reactions    Lopid [Gemfibrozil] Other (See Comments)     Lab values were abnormal    Metoprolol Headache     migraine    Statins-Hmg-Coa Reductase Inhibitors     Lexapro [Escitalopram Oxalate] Other (See Comments) and Anxiety     depression       Changes to allergies: No    SPECIALTY MEDICATION ADHERENCE     Repatha 140 mg/ml: 17 days of medicine on hand       Medication Adherence    Patient reported X missed doses in the last month: 0  Specialty Medication: Repatha 140 mg/mL  Informant: patient                            Specialty medication(s) dose(s) confirmed: Regimen is correct and unchanged.     Are there  any concerns with adherence? No    Adherence counseling provided? Not needed    CLINICAL MANAGEMENT AND INTERVENTION      Clinical Benefit Assessment:    Do you feel the medicine is effective or helping your condition? Yes    Clinical Benefit counseling provided? Not needed    Adverse Effects Assessment:    Are you experiencing any side effects? Yes, patient reports experiencing flu-like symptoms (lasting 1.5 days after first dose). Side effect counseling provided: Discussed Repatha side effects and mentioned flu-like symptoms can occur that typically lasts 24-48 hours. Encouraged patient to call his provider if the side effects worsen or become intolerable during his next injections.     Are you experiencing difficulty administering your medicine? No    Quality of Life Assessment:       How many days over the past month did your CAD  keep you from your normal activities? For example, brushing your teeth or getting up in the morning. Patient declined to answer    Have you discussed this with your provider? Not needed    Acute Infection Status:    Acute infections noted within Epic:  No active infections  Patient reported infection: None    Therapy Appropriateness:    Is therapy appropriate and patient progressing towards therapeutic goals? Yes, therapy is appropriate and should be continued    DISEASE/MEDICATION-SPECIFIC INFORMATION      For patients on injectable medications: Patient currently has 1 doses left.  Next injection is scheduled for 02/07/22.    PATIENT SPECIFIC NEEDS     Does the patient have any physical, cognitive, or cultural barriers? No    Is the patient high risk? No    Does the patient require a Care Management Plan? No     SOCIAL DETERMINANTS OF HEALTH     At the [facility], we have learned that life circumstances - like trouble affording food, housing, utilities, or transportation can affect the health of many of our patients.   That is why we wanted to ask: are you currently experiencing any life circumstances that are negatively impacting your health and/or quality of life? Patient declined to answer    Social Determinants of Health     Financial Resource Strain: Low Risk  (05/12/2019)    Overall Financial Resource Strain (CARDIA)     Difficulty of Paying Living Expenses: Not hard at all   Internet Connectivity: Not on file   Food Insecurity: Unknown (05/12/2019)    Hunger Vital Sign     Worried About Running Out of Food in the Last Year: Patient refused     Ran Out of Food in the Last Year: Patient refused   Tobacco Use: High Risk (01/19/2022)    Patient History     Smoking Tobacco Use: Every Day     Smokeless Tobacco Use: Current     Passive Exposure: Not on file   Housing/Utilities: Unknown (09/14/2020)    Housing/Utilities     Within the past 12 months, have you ever stayed: outside, in a car, in a tent, in an overnight shelter, or temporarily in someone else's home (i.e. couch-surfing)?: No     Are you worried about losing your housing?: Not on file Within the past 12 months, have you been unable to get utilities (heat, electricity) when it was really needed?: Not on file   Alcohol Use: Not on file   Transportation Needs: Unknown (05/12/2019)    PRAPARE - Transportation  Lack of Transportation (Medical): Patient refused     Lack of Transportation (Non-Medical): Patient refused   Substance Use: Not on file   Health Literacy: Low Risk  (09/14/2020)    Health Literacy     : Never   Physical Activity: Not on file   Interpersonal Safety: Not on file   Stress: Not on file   Intimate Partner Violence: Not on file   Depression: Not on file   Social Connections: Not on file       Would you be willing to receive help with any of the needs that you have identified today? Not applicable       SHIPPING     Specialty Medication(s) to be Shipped:   General Specialty: Repatha    Other medication(s) to be shipped: No additional medications requested for fill at this time     Changes to insurance: No    Delivery Scheduled: Yes, Expected medication delivery date: 02/17/22.     Medication will be delivered via UPS to the confirmed prescription address in [facility].    The patient will receive a drug information handout for each medication shipped and additional FDA Medication Guides as required.  Verified that patient has previously received a Conservation officer, historic buildings and a Surveyor, mining.    The patient or caregiver noted above participated in the development of this care plan and knows that they can request review of or adjustments to the care plan at any time.      All of the patient's questions and concerns have been addressed.    Philomena Doheny   [facility] Specialty Pharmacist

## 2022-02-11 NOTE — Unmapped (Signed)
NEW PATIENT SCREENING-Psychiatry @ [facility]    **LOCAL SERVICES-Medicaid LME-[facility], insurance provider, [email or web address], ???google??? psychiatry/psychology in local area**    **IF PATIENT HAS BCBS [place] [employer] INSURANCE, PROCEED WITH SCREENING**    Procedure  Do you have BCBS [facility] Insurance?  [x] YES-proceed to screening questions    Did clinic staff reach out to you already, or was a referral sent in on your behalf that you're aware of?  [] YES-proceed to EPIC to review referral/documentation encounter  [x] NO-are you self-referred?   [x] YES-proceed to screening questions   [] NO-referred by who?     SCREENING QUESTIONS:  What services are you looking for?     [x] Meds/diagnosis review   [] Therapy only (suggest use of local except WMD)    [] WMD therapy follow [facility] algorithm or email community resources   [] Psychological/Neuropsychological testing (suggest use of local)      ADULT 18-64 (MEDICATIONS/DIAGNOSIS REVIEW):  Do you have a confirmed diagnosis?   [] YES-  [] YES to schizophrenia/manic/psychosis/bipolar with mania or psychosis or recent hospitalization  refer to [facility] and [facility] [phone]  [] YES to new onset psychosis/psychotic break within 3 years, ages 26-36  refer to [facility] [phone]  [x] NO-follow screening below, according to age/diagnosis:      Is patient under 34 with general diagnosis anxiety/depression/OCD/mood disorder/etc(NOT accepting ADHD/ADD diagnosis)-pt wanting medication management for Add/Adhd  [] YES, patient may be appropriate for ADTC/NP    Does patient have a [place] PCP(1+ visit in 12 months) or Specialist(3+ visits in 12 months-same speciality clinic)?  [] YES-add to wait list (ADTC resident or NP)   **Send adult packet if not enough psych information in Epic**  [] NO-does patient live in [place]?  [] YES-send packet (not added to wait list until packet returned and reviewed for acceptance)  [] NO-suggest use of local services      ADULT 65+ (MEDICATIONS/DIAGNOSIS REVIEW):  Is patient 65+ OR have confirmed cognitive diagnosis?(NOT accepting ADHD/ADD diagnosis)  [] YES-patient may be appropriate for GERO    Does patient have a [place] PCP(1+ visit in 12 months) or Specialist(3+ visits in 12 months-same speciality clinic)?  [] YES-add to wait list (GERO resident)   **Send adult packet if not enough psych information in Epic**  [] NO-does patient live in [place]?  [] YES-send packet (not added to wait list until packet returned and reviewed for acceptance)  [] NO-suggest use of local services      WMD/PERINATAL/POSTPARTUM up to 12 months (MEDICATIONS/DIAGNOSIS REVIEW):  Is patient perinatal/postpartum (up to 12 months)/preconception/PMDD/perimenopause?  [] YES-patient is appropriate for WMD (no packet required)   [] Perinatal/postpartum   [] Insured-Nathan(Tues/Wed)/Riddle/Myers  [] Uninsured/F2F-WMD resident-schedule or add to wait list  [] PMDD-smart phrase screening(16+)  [] Insured-Riddle/Myers  [] Uninsured/F2F-WMD resident-schedule or add to wait list  [] Preconception planning  [] Insured-Nathan(Tues/Wed)/Riddle  [] Uninsured/F2F-WMD resident-schedule or add to wait list  [] Perimenopause-smart phrase screening  [] Insured-Nathan(Mon)/Riddle   [] Uninsured/F2F-WMD resident-schedule or add to wait list      Child/DD-adult & child (MEDICATION/DIAGNOSIS REVIEW): (not added to wait list until packet returned and reviewed for acceptance)  Is there a confirmed/suspected developmental or neurodevelopmental diagnosis?  [] YES-send packet (child/DD)  [] NO-patient live in [place] or has a [place] PCP(1+ visit in 12 months) or Specialist(3+ visits in 12 months-same speciality clinic)?  [] YES-send packet  [] NO-suggest use of local services      OUTCOME: Writer explained our clinic was no longer accepting referrals for ADD/ADHD medication management-he is going to provider       [] Aware F2F appointments may be required, clinic located in [place]. Video option not a  guarantee    [] Packet sent:  patient aware packet will need to be returned within 60 days to remain on wait list or for referral to remain in active status

## 2022-02-16 MED FILL — REPATHA SURECLICK 140 MG/ML SUBCUTANEOUS PEN INJECTOR: SUBCUTANEOUS | 28 days supply | Qty: 2 | Fill #1

## 2022-03-11 NOTE — Unmapped (Signed)
Pulmonary Clinic - Return Visit    Referring Physician :  [facility]*  PCP:     [facility], [place]  Reason for Consult:   COPD    HISTORY:     History of Present Illness:  Mr. Burdell is a 56 y.o. male with a history of COPD, CAD s/p PCI to RCA in 2018, HTN, HLD, GERD, renal stones, persistent tobacco use (cigarettes)  whom we are seeing in consultation requested by [facility]* for evaluation of COPD management and worsening shortness of breath.    Pertinent History/Chart Review   Mr. Manahan was 1st seen in March 2023 by our pulmonary clinic. He presented with SOB with minimal activity, productive cough (white/clear), dizzy, wakes up at night bc can't breathe. His symptoms improved some with duo neb, more with Trelegy. He is a current smoker (20 pk/yr) who is trying to quit. Medication selection and treatment options limited by finances. He was seen by Cardiology 10/16/2021 history notable for fatigue, anemia, and leukocytosis, CBC was normal in May.  All of his w/u has been negative to date (other than emphysema) wtih the primary complaint being fatigue    Interim History    His breathing hasn't changed.   He takes the trelegy and it definitely helps, but he still feels really limited.    He doesn't feel like he can do much--he can do his ADLs.  Even taking a shower he gets SOB    + cough, s/t productive.  Most of the time it's clear.  No hemoptysis.  He did pulm rehab on line but it wasn't working for him so he stopped it.  It was too much and made his back hurt.    He isn't doing much    He usually takes seroquel to help him sleep as without it he won't go to sleep.  He is scheduled for a sleep study in November.    He continues to smoke 6-8 cigarettes per day.  He has tried patches, gum.  Chantix made him more depressed. Wellbrutirn did the same.      He has not experienced fever, chills     Most recent MMRC dyspnea scale: 1 Last MMRC date: 03/29/2019    Date CAT [facility] March 29, 2019  1    August 28, 2021  19     December 18, 2021 21          Past Medical History:  Past Medical History:   Diagnosis Date    Blood in stool     Colon polyps 2016    adenomatous polyp    Constipation     COPD (chronic obstructive pulmonary disease) (CMS-HCC)     Diarrhea     Diverticulosis of colon     GERD (gastroesophageal reflux disease) 2016    esophagitis    Hyperlipidemia     Hypertension     Kidney stones     Migraines      Past Surgical History:   Procedure Laterality Date    PR CATH PLACE/CORON ANGIO, IMG SUPER/INTERP,W LEFT HEART VENTRICULOGRAPHY N/A 04/02/2017    Procedure: CATH LEFT HEART CATHETERIZATION W INTERVENTION;  Surgeon: Alvira Philips, MD;  Location: [facility] CATH;  Service: Cardiology    PR COLONOSCOPY W/BIOPSY SINGLE/MULTIPLE N/A 07/05/2014    Procedure: COLONOSCOPY, FLEXIBLE, PROXIMAL TO SPLENIC FLEXURE; WITH BIOPSY, SINGLE OR MULTIPLE;  Surgeon: Dewaine Conger, MD;  Location: [facility] PROCEDURES [facility];  Service: Gastroenterology  PR ESOPHAGEAL MOTILITY STUDY, MANOMETRY N/A 07/04/2015    Procedure: ESOPHAGEAL MOTILITY STUDY W/INT & REP;  Surgeon: Nurse-Based Giproc;  Location: [facility]URES [facility];  Service: Gastroenterology    PR GERD TST W/ MUCOS IMPEDE ELECTROD,>1HR N/A 07/04/2015    Procedure: ESOPHAGEAL FUNCTION TEST, GASTROESOPHAGEAL REFLUX TEST W/ NASAL CATHETER INTRALUMINAL IMPEDANCE ELECTRODE(S) PLACEMENT, RECORDING, ANALYSIS AND INTERPRETATION; PROLONGED;  Surgeon: Nurse-Based Giproc;  Location: GI PROCEDURES [facility];  Service: Gastroenterology    PR UP GI ENDOSCOPY,BALL DIL,30MM N/A 07/05/2014    Procedure: UGI ENDO; W/BALLOON DILAT ESOPHAGUS (<30MM DIAM);  Surgeon: Dewaine Conger, MD;  Location: [facility];  Service: Gastroenterology    PR UP GI ENDOSCOPY,BALL DIL,30MM N/A 02/01/2018    Procedure: UGI ENDO; W/BALLOON DILAT ESOPHAGUS (<30MM DIAM);  Surgeon: Leland Her, MD;  Location: GI PROCEDURES [facility];  Service: Gastroenterology    PR UP GI ENDOSCOPY,DILATN W GUIDE N/A 11/16/2014    Procedure: UGI ENDOSCOPY; WITH INSERTION OF GUIDE WIRE FOLLOWED BY DILATION OF ESOPHAGUS OVER GUIDE WIRE;  Surgeon: Erskin Burnet, MD;  Location: GI PROCEDURES [facility];  Service: Gastroenterology    PR UPPER GI ENDOSCOPY,BIOPSY N/A 07/05/2014    Procedure: UGI ENDOSCOPY; WITH BIOPSY, SINGLE OR MULTIPLE;  Surgeon: Dewaine Conger, MD;  Location: [facility];  Service: Gastroenterology    PR UPPER GI ENDOSCOPY,W/DIR SUBMUC INJ N/A 02/01/2018    Procedure: UGI ENDO, INCL ESOPH/STOMACH/& EITHER DUODENUM AND/OR JEJUNUM AS APPROP; WITH DIRECTED SUBMUCOSAL INJECTION;  Surgeon: Leland Her, MD;  Location: GI PROCEDURES [facility];  Service: Gastroenterology    SALIVARY GLAND SURGERY         Other History:  The social history and family history were personally reviewed and updated in the patient's electronic medical record.    Family History   Problem Relation Age of Onset    Colorectal Cancer Neg Hx     Crohn's disease Neg Hx     Ulcerative colitis Neg Hx      Social History     Socioeconomic History    Marital status: Married   Tobacco Use    Smoking status: Every Day     Packs/day: 0.50     Years: 40.00     Additional pack years: 0.00     Total pack years: 20.00     Types: Cigarettes    Smokeless tobacco: Current     Types: Snuff    Tobacco comments:     pt reports that he is down to 6 cigs/day- after meals   Vaping Use    Vaping Use: Some days    Substances: Nicotine   Substance and Sexual Activity    Alcohol use: No     Alcohol/week: 0.0 standard drinks of alcohol    Drug use: Not Currently     Frequency: 2.0 times per week     Comment: has decreased MJ use down to 3x/week- previously documented as 14    Sexual activity: Yes     Partners: Female   Social History Narrative    Was incarcerated 5 days just prior to admission. Denies any recent drug use other than marijuana, states that he remotely used cocaine and was previously incarcerated 6 years for drug charges.        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 (Reviewed 09/02/17)     Social Determinants of Health     Financial Resource Strain: Low  Risk  (05/12/2019)    Overall Financial Resource Strain (CARDIA)     Difficulty of Paying Living Expenses: Not hard at all   Food Insecurity: Unknown (05/12/2019)    Hunger Vital Sign     Worried About Running Out of Food in the Last Year: Patient refused     Ran Out of Food in the Last Year: Patient refused   Transportation Needs: Unknown (05/12/2019)    PRAPARE - Therapist, art (Medical): Patient refused     Lack of Transportation (Non-Medical): Patient refused       Home Medications:  Current Outpatient Medications on File Prior to Visit   Medication Sig Dispense Refill    aspirin (ECOTRIN) 81 MG tablet Take 1 tablet (81 mg total) by mouth daily. 90 each 1    atorvastatin (LIPITOR) 80 MG tablet TAKE ONE (1) TABLET BY MOUTH ONCE DAILY 90 tablet 0    carvediloL (COREG) 12.5 MG tablet Take 1 tablet (12.5 mg total) by mouth Two (2) times a day. 180 tablet 3    cetirizine (ZYRTEC) 10 MG tablet       empty container (SHARPS-A-GATOR DISPOSAL SYSTEM) Misc Use as directed for sharps disposal 1 each 2    evolocumab (REPATHA SURECLICK) 140 mg/mL PnIj Inject the contents of one pen (140 mg) under the skin every fourteen (14) days. 2 mL 5    ezetimibe (ZETIA) 10 mg tablet TAKE ONE TABLET BY MOUTH DAILY. 90 tablet 0    fluticasone propionate (FLONASE) 50 mcg/actuation nasal spray       fluticasone-umeclidin-vilanter (TRELEGY ELLIPTA) 100-62.5-25 mcg inhaler Inhale 1 puff daily. 60 each 5    ipratropium-albuteroL (DUO-NEB) 0.5-2.5 mg/3 mL nebulizer USE 1 VIAL VIA NEBULIZER EVERY 6 HOURS AS NEEDED. 360 mL 6    nitroglycerin (NITROSTAT) 0.4 MG SL tablet PLACE 1 TABLET UNDER THE TONGUE EVERY 5 MINUTES AS NEEDED FOR CHEST PAIN. MAX OF3 DOSES IN 15 MINUTES. 25 each 0    omeprazole (PRILOSEC) 20 MG capsule TAKE (1) CAPSULE BY MOUTH TWICE DAILY BEFORE MEALS 180 capsule 1    ondansetron (ZOFRAN-ODT) 8 MG disintegrating tablet       prasugreL (EFFIENT) 10 mg tablet Take 1 tablet (10 mg total) by mouth daily. 90 tablet 3    QUEtiapine (SEROQUEL) 100 MG tablet       tamsulosin (FLOMAX) 0.4 mg capsule Take by mouth.      albuterol HFA 90 mcg/actuation inhaler Inhale 2 puffs every six (6) hours as needed for wheezing. 8.5 g 2    lisinopriL (PRINIVIL,ZESTRIL) 10 MG tablet Take 1 tablet (10 mg total) by mouth daily. 30 tablet 6     No current facility-administered medications on file prior to visit.       Allergies:  Allergies as of 03/12/2022 - Reviewed 03/12/2022   Allergen Reaction Noted    Lopid [gemfibrozil] Other (See Comments) 07/05/2014    Metoprolol Headache 04/01/2017    Statins-hmg-coa reductase inhibitors  10/30/2016    Lexapro [escitalopram oxalate] Other (See Comments) and Anxiety 07/04/2015       Review of Systems:  A comprehensive review of systems was completed and negative except as noted in HPI.    PHYSICAL EXAM:   BP 110/76  - Pulse 78  - Temp 36.2 ??C (97.2 ??F)  - Ht 185.4 cm (6' 1)  - Wt 87.4 kg (192 lb 9.6 oz)  - SpO2 98%  - BMI 25.41 kg/m??   GEN: NAD, sitting  in chair looking straight ahead  EYES: EOMI, sclera anicteric  ENT: Trachea midline, MMM  CV: RRR, no murmurs appreciated  PULM: CTA BIlaterally today, with only a harsh expiratory upper airway wheeze on forced exhalation  EXT: No edema, no clubbing  NEURO: Grossly Non-focal, moving all extremities normally  PSYCH: A+Ox3, appropriate    LABORATORY and RADIOLOGY DATA:     Pulmonary Function Tests/Interpretation:    Date FEV1  (Pre/Post) FVC  (Pre/Post) FEV1/FVC  (Pre/Post) DLCO   07/31/21  1.97 (49.9%) / 2.01 (51.0%) 4.35 (85.4%) / 4.45 (87.3%) 45% / 45% 22.4 (75%)     Spirometry:  Spirometry shows moderate obstructive impairment. No improvement after inhaled bronchodilator  Flow Volume:  normal forced inspiratory flows.    Lung volume:  Lung volumes are consistent with hyperinflation and Lung volumes are consistent with gas trapping. (RV 232%, TLC 132%, FRC 186%)    DLCO:  DLCO is mildly reduced      : not performed today  Informal with me in the hallway and up half a flight of stairs resulted in chest tightness with still good air movement on exam, O2 saturation nadir ws 92%.  Walked less than 91m      Pertinent Laboratory Data:  10/16/21: normal folate, iron panel, ESR. Mildly low TSH (0.385). Nl hgb, wbc, plts     Pertinent Imaging Data:  Screening Chest CCT 07/17/21: mild emphysema and bronchial wall thickening, some mucous plugging and micronodules, Lung RADS 1--repeat in 12 months    V/Q/Spect: normal perfusion, changes c/w COPD and emphysema        Echo 08/25/21: normal EF, RV not well visualized but probably normal; difficult study    ASSESSMENT and PLAN     TRESHAWN PLEVA is a 56 y.o. male with COPD, CAD s/p PCI to RCA in 2018, HTN, HLD, GERD, renal stones, persistent tobacco use (cigarettes) who is here for COPD management    There are no diagnoses linked to this encounter.    #COPD   Borderline moderate-severe by GOLD criteria; GOLD 2B. Hyperinflation and gas-trapping on PFTs as well as chest CT. Persistent symptoms of shortness of breath with exertion, chest tightness with SOB, some phlegm production. Reduced activity level 2/2 difficulty breathing.       - Continue Trelegy  - Continue Albuterol PRN  - Currently not a candidate for BLVR, but he can abstain from smoking, he would qualify for them and they could improve his sxs  - really needs to work on smoking cessation.  Have pushed him to be down to 4 cigs a day by next visit      #Shortness of Breath  COPD is likely a major contributor to patient's SOB, however SOB is out of proportion to patient's moderate-severe COPD by GOLD criteria and is not responding well to albuterol. Other possible contributing factors include cardiac and deconditioning. No anemia on recent CBC.     -sleep study in November (ordered already)  - with f/u    Have had conversations with his cardiology team and he complained mostly of fatigue to them but definitely describes chest tightness to me.   I am limited in what else I can offer as although he has lung disease, he is on maximal therapy and seems to be able to use the inhaler, neg V/Q, stopped pulm rehab, definitely has horrible emphysema but still smoking so can't get valves, but symptoms are definitely out of proportion to his degree of obstruction, didn't  have hypoxia with the mild ambuliaton we did today.  I am hoping a CPAP might help (if he qualifies) and will con't to discuss smoking cessation with him.      Needs his sleep study  wiwth f/u visit     Return in about 6 months (around 09/11/2022).      ZO:XWRUEAVW [employer], [place]

## 2022-03-12 ENCOUNTER — Ambulatory Visit: Admit: 2022-03-12 | Discharge: 2022-03-13 | Payer: PRIVATE HEALTH INSURANCE

## 2022-03-12 NOTE — Unmapped (Signed)
[facility] Refill Coordination Note    Specialty Medication(s) to be Shipped:   General Specialty: Repatha    Other medication(s) to be shipped: No additional medications requested for fill at this time     Chase Mendoza, DOB: March 26, 1966  Phone: [phone] (home)       All above HIPAA information was verified with patient.     Was a Nurse, learning disability used for this call? No    Completed refill call assessment today to schedule patient's medication shipment from the [facility] [phone]).  All relevant notes have been reviewed.     Specialty medication(s) and dose(s) confirmed: Regimen is correct and unchanged.   Changes to medications: [name] reports no changes at this time.  Changes to insurance: No  New side effects reported not previously addressed with a pharmacist or physician: None reported  Questions for the pharmacist: No    Confirmed patient received a Conservation officer, historic buildings and a Surveyor, mining with first shipment. The patient will receive a drug information handout for each medication shipped and additional FDA Medication Guides as required.       DISEASE/MEDICATION-SPECIFIC INFORMATION        For patients on injectable medications: Patient currently has 1 doses left.  Next injection is scheduled for 03/14/22.    SPECIALTY MEDICATION ADHERENCE     Medication Adherence    Patient reported X missed doses in the last month: 0  Specialty Medication: Repatha 140mg /ml  Patient is on additional specialty medications: No  Patient is on more than two specialty medications: No                          Were doses missed due to medication being on hold? No    Repatha 140 mg/ml: 3 days of medicine on hand     REFERRAL TO PHARMACIST     Referral to the pharmacist: Not needed      [facility]     Shipping address confirmed in Epic.     Delivery Scheduled: Yes, Expected medication delivery date: 03/18/22.     Medication will be delivered via UPS to the prescription address in Epic WAM.    Nancy Nordmann [facility] Specialty Technician

## 2022-03-12 NOTE — Unmapped (Addendum)
I think the sleep study is really important now to see if that can help--can you call when it is completed so we can see the results?    Can try taking the albuterol before you go to bed to see if that helps    Best thing you can do now is stop smoking--it would help your breathing, your cough, and we could then look to see if you qualify for the valves.  I am charging you to be down to 4 cigarettes a day (or less) the next time you come.    I don't have a lot of other recommendations for you at this time but could try to switch you to all nebulized medication if you felt like you weren't able to get the Trelegy in.      Thank you for visiting the [facility]. You may receive a survey regarding your visit - we are very interested in your responses so we can continue to improve your clinic experience.       For medical emergencies: please call 911     For urgent medical issues after hours: [phone], [facility] Operator (ask for the pulmonary fellow on call)     For the COPD nurse, Selinda Eon, call my office at [phone] (except on Wednesdays, then call the clinic)    For pulmonary clinic appointments/Nurse line: please call [phone] or send a message through [facility]    For ANCA Vasculitis/Nephrology clinic questions and/or appointments: [phone]

## 2022-03-18 MED FILL — REPATHA SURECLICK 140 MG/ML SUBCUTANEOUS PEN INJECTOR: SUBCUTANEOUS | 28 days supply | Qty: 2 | Fill #2

## 2022-04-14 ENCOUNTER — Ambulatory Visit: Admit: 2022-04-14 | Discharge: 2022-04-14 | Payer: PRIVATE HEALTH INSURANCE

## 2022-04-14 NOTE — Unmapped (Signed)
[facility] Refill Coordination Note    Specialty Medication(s) to be Shipped:   General Specialty: Repatha    Other medication(s) to be shipped: No additional medications requested for fill at this time     Chase Mendoza, DOB: [birth year]  Phone: [phone] (home)       All above HIPAA information was verified with patient.     Was a Nurse, learning disability used for this call? No    Completed refill call assessment today to schedule patient's medication shipment from the [facility] [phone]).  All relevant notes have been reviewed.     Specialty medication(s) and dose(s) confirmed: Regimen is correct and unchanged.   Changes to medications: [name] reports no changes at this time.  Changes to insurance: No  New side effects reported not previously addressed with a pharmacist or physician: None reported  Questions for the pharmacist: No    Confirmed patient received a Conservation officer, historic buildings and a Surveyor, mining with first shipment. The patient will receive a drug information handout for each medication shipped and additional FDA Medication Guides as required.       DISEASE/MEDICATION-SPECIFIC INFORMATION        For patients on injectable medications: Patient currently has 1 doses left.  Next injection is scheduled for 04/19/22.    SPECIALTY MEDICATION ADHERENCE     Medication Adherence    Patient reported X missed doses in the last month: 0  Specialty Medication: REPATHA SURECLICK 140 mg/mL  Patient is on additional specialty medications: No                                Were doses missed due to medication being on hold? No    repatha 140 mg/ml: 5 days of medicine on hand        REFERRAL TO PHARMACIST     Referral to the pharmacist: Not needed      [facility]     Shipping address confirmed in Epic.     Delivery Scheduled: Yes, Expected medication delivery date: 04/24/22.     Medication will be delivered via UPS to the prescription address in Epic WAM.    Unk Lightning   [facility] Specialty Technician

## 2022-04-22 DIAGNOSIS — R0609 Other forms of dyspnea: Principal | ICD-10-CM

## 2022-04-22 DIAGNOSIS — E782 Mixed hyperlipidemia: Principal | ICD-10-CM

## 2022-04-22 DIAGNOSIS — I2510 Atherosclerotic heart disease of native coronary artery without angina pectoris: Principal | ICD-10-CM

## 2022-04-22 DIAGNOSIS — Z720 Tobacco use: Principal | ICD-10-CM

## 2022-04-22 LAB — LDL CHOLESTEROL, DIRECT: LDL CHOLESTEROL DIRECT: 51 mg/dL

## 2022-04-22 MED ORDER — CLOPIDOGREL 75 MG TABLET
ORAL_TABLET | Freq: Every day | ORAL | 3 refills | 90 days | Status: CP
Start: 2022-04-22 — End: 2023-04-22

## 2022-04-22 NOTE — Unmapped (Signed)
DIVISION OF CARDIOLOGY   [employer] [place], [place]                                                                         Date of Service:  04/22/2022     Assessment/Plan:   1. CAD  s/p PCI to RCA (03/2017) with 50% mLCX lesion which has been medically managed. Had progressive anginal sx and underwent PCI of ISR of mRCA stent at [facility] 04/14/21 (due to insurance). LCx dz similar to prior.  Did not tolerate imdur (HA). No overt exertional angina, appears stable. Echo w/ normal LV & RV function.   - ASA 81 mg daily, prasugrel 10 mg daily>>switch to plavix monotherapy as he is >1 yr out (HOST-EXAM trial)  - carvedilol 12.5 mg BID  - lipid mgmt as per below    2. Essential hypertension  Well controlled     3. Tobacco abuse disorder  1/2 ppd. I counseled him on the importance of quitting smoking.      4. Mixed hyperlipidemia  atorvastatin 80, Zetia 10 mg. Repatha added last visit. Has had prior LDL>200, c/f familial HLD  - recheck LDL    Lab Results   Component Value Date    LDL 84 06/26/2021    LDL 108.0 06/26/2021     5. COPD  moderate to severe COPD, cont'd smoking. Followed by pulmonology (Dr. Orson Aloe)  SOB is worse w/ +sputum/coughing, no hypoxia >>messaged Dr. Orson Aloe    Return to clinic: Return in about 3 months (around 07/23/2022).    I personally spent 31 minutes face-to-face and non-face-to-face in the care of this patient, which includes all pre, intra, and post visit time on the date of service.      Subjective:   [facility], [place]  Patient ID: Chase Mendoza is an 56 y.o. male patient with CAD s/p PCI of RCA (04/2021), HTN, HLD, tobacco use, COPD, GERD who presents for follow-up.    History present illness:  Mr. Bonno is s/p PCI to RCA (03/2017) with 50% mLCX lesion which has been medically managed. Had progressive anginal sx and underwent PCI of ISR of mRCA stent at [facility] 04/14/21 (due to insurance). LCx dz similar to prior.    Last seen in August by myself. Was not at goal on max statin/zetia. Was able to get insurance approval for Repatha.   Here today for f/up. Tolerating repatha well. No significant chest pain. Took NTG x 1 last week.  No swelling, palpitations, LH/dizziness.   Had sleep study showing mild OSA (moderate in REM) -  needs cpap titration  Breathing is worse, productive cough - thick brownish green.     Continues to work at [employer] when he can  Mother and brother both died of MI.     Objective:   Physical Exam:  BP 135/74 (BP Site: L Arm, BP Position: Sitting, BP Cuff Size: Medium)  - Pulse 84  - Temp 35.9 ??C (96.7 ??F) (Temporal)  - Ht 185.4 cm (6' 0.99)  - Wt 89.8 kg (198 lb)  - SpO2 98%  - BMI 26.13 kg/m??   General-  Normal appearing male in no apparent distress.  Neurologic- Alert and oriented X3.  Cranial nerve II-XII grossly intact.  HEENT-  Normocephalic atraumatic head.  No scleral icterus.  MMM  Neck- Supple, no carotid bruis, no JVD  Lungs- Clear to auscultation, no wheezes, rhonchi, or rhales. +cougiing  Heart- RRR, nl S1S2, no MRG  Extremities-  No clubbing or cyanosis.  No LE edema  Pulses- +2 pulses in radial bilaterally.  Psych- Normal mood, appropriate.     Notable results:  Labs:   Lab Results   Component Value Date    WBC 10.9 10/16/2021    HGB 14.7 10/16/2021    HCT 44.3 10/16/2021    PLT 254 10/16/2021     Lab Results   Component Value Date    NA 143 10/16/2021    K 4.0 10/16/2021    CREATININE 0.87 10/16/2021      Lab Results   Component Value Date    CHOL 146 06/26/2021    LDL 84 06/26/2021    LDL 108.0 06/26/2021    HDL 27 (L) 06/26/2021    TRIG 177 (H) 06/26/2021     Lab Results   Component Value Date    A1C 5.7 (H) 05/13/2019      Imaging/Other:  Electrocardiogram:    From 10/16/21 showed NSR.      Echo:  From 05/29/17 showed normal left ventricular systolic function, ejection fraction > 55%. Normal right ventricular systolic function. No significant valvular abnormalities.    From 08/25/21 showed LVEF 55%. The right ventricle is not well visualized but probably normal in size, with normal systolic function.     Nuclear Stress Test:  From 10/2017 was a normal myocardial perfusion study. No evidence for significant ischemia or scar noted. Post-stress EF > 65%.     From 05/26/19 showed normal myocardial perfusion study. No evidence for significant ischemia or scar is noted. Post stress: Global systolic function is normal. The ejection fraction calculated at 60%. Attenuation CT scan shows post PCI findings. CT finding show emphysematous and non-specific ground glass changes of the lungs.     Cardiac Catheterization:  From 03/2017 showed normal LV systolic function. Coronary artery disease including very long 50% mid-RCA stenosis (FFR = 0.80) and 50% mid-circumflex stenosis. Successful PCI of mid-RCA with placement of an Onyx drug eluting stent.     From [facility] 04/14/21 showed 2 vessel coronary artery disease.   Successful PCI of mid RCA. LCx disease is similar to 2018   ISR of mid RCA was target for intervention. 2.25 x 12-mm PTCA balloon, HD IVUS used for optimization. Prior stents were expanded and apposed. Diffuse ISR. 3.0 x 28-mm Synergy DES at 16 atm. 3.0 x 20-mm [place] Emerge used to post-dilate.        Glade Stanford, AGNP-C  Cardiology Nurse Practitioner  [facility]

## 2022-04-22 NOTE — Unmapped (Signed)
Patient returns for three month follow up. Started Repatha a couple of months ago. Denies concerns today. Has a persistent cough (not new).

## 2022-04-22 NOTE — Unmapped (Addendum)
Stop aspirin and prasugrel, start clopidogrel the next day.     Get labs    No other changes.

## 2022-04-23 MED FILL — REPATHA SURECLICK 140 MG/ML SUBCUTANEOUS PEN INJECTOR: SUBCUTANEOUS | 28 days supply | Qty: 2 | Fill #3

## 2022-04-23 NOTE — Unmapped (Signed)
Patient notified cholesterol is now at goal since on repatha and to continue this medication.  Patient verbalized understanding.

## 2022-04-23 NOTE — Unmapped (Signed)
-----   Message from Claris Gower, [place] sent at 04/23/2022  9:40 AM EST -----  Please let him know cholesterol looks great now that he's on repatha - it's dropped by more than half and is at goal now.

## 2022-04-25 ENCOUNTER — Ambulatory Visit
Admit: 2022-04-25 | Discharge: 2022-04-26 | Disposition: A | Discharge status: Home / Self Care | Payer: PRIVATE HEALTH INSURANCE | Attending: Emergency Medicine

## 2022-04-25 ENCOUNTER — Emergency Department
Admit: 2022-04-25 | Discharge: 2022-04-26 | Disposition: A | Discharge status: Home / Self Care | Payer: PRIVATE HEALTH INSURANCE | Attending: Emergency Medicine

## 2022-04-25 DIAGNOSIS — J441 Chronic obstructive pulmonary disease with (acute) exacerbation: Principal | ICD-10-CM

## 2022-04-25 LAB — COMPREHENSIVE METABOLIC PANEL
ALBUMIN: 3.5 g/dL (ref 3.4–5.0)
ALKALINE PHOSPHATASE: 86 U/L (ref 46–116)
ALT (SGPT): 14 U/L (ref 10–49)
ANION GAP: 6 mmol/L (ref 5–14)
AST (SGOT): 15 U/L (ref <=34)
BILIRUBIN TOTAL: 0.5 mg/dL (ref 0.3–1.2)
BLOOD UREA NITROGEN: 5 mg/dL — ABNORMAL LOW (ref 9–23)
BUN / CREAT RATIO: 6
CALCIUM: 8.9 mg/dL (ref 8.7–10.4)
CHLORIDE: 104 mmol/L (ref 98–107)
CO2: 27.7 mmol/L (ref 20.0–31.0)
CREATININE: 0.86 mg/dL
EGFR CKD-EPI (2021) MALE: >90 mL/min/{1.73_m2} (ref >=60)
GLUCOSE RANDOM: 110 mg/dL (ref 70–179)
POTASSIUM: 3.7 mmol/L (ref 3.4–4.8)
PROTEIN TOTAL: 6.6 g/dL (ref 5.7–8.2)
SODIUM: 138 mmol/L (ref 135–145)

## 2022-04-25 LAB — CBC W/ AUTO DIFF
BASOPHILS ABSOLUTE COUNT: 0.1 10*9/L (ref 0.0–0.1)
BASOPHILS RELATIVE PERCENT: 0.9 %
EOSINOPHILS ABSOLUTE COUNT: 0.5 10*9/L (ref 0.0–0.5)
EOSINOPHILS RELATIVE PERCENT: 2.9 %
HEMATOCRIT: 40.4 % (ref 39.0–48.0)
HEMOGLOBIN: 13.5 g/dL (ref 12.9–16.5)
LYMPHOCYTES ABSOLUTE COUNT: 4.9 10*9/L — ABNORMAL HIGH (ref 1.1–3.6)
LYMPHOCYTES RELATIVE PERCENT: 30.9 %
MEAN CORPUSCULAR HEMOGLOBIN CONC: 33.5 g/dL (ref 32.0–36.0)
MEAN CORPUSCULAR HEMOGLOBIN: 29.4 pg (ref 25.9–32.4)
MEAN CORPUSCULAR VOLUME: 87.8 fL (ref 77.6–95.7)
MEAN PLATELET VOLUME: 8.4 fL (ref 6.8–10.7)
MONOCYTES ABSOLUTE COUNT: 1 10*9/L — ABNORMAL HIGH (ref 0.3–0.8)
MONOCYTES RELATIVE PERCENT: 6.1 %
NEUTROPHILS ABSOLUTE COUNT: 9.4 10*9/L — ABNORMAL HIGH (ref 1.8–7.8)
NEUTROPHILS RELATIVE PERCENT: 59.2 %
NUCLEATED RED BLOOD CELLS: 0 /100{WBCs} (ref <=4)
PLATELET COUNT: 273 10*9/L (ref 150–450)
RED BLOOD CELL COUNT: 4.6 10*12/L (ref 4.26–5.60)
RED CELL DISTRIBUTION WIDTH: 14.2 % (ref 12.2–15.2)
WBC ADJUSTED: 15.8 10*9/L — ABNORMAL HIGH (ref 3.6–11.2)

## 2022-04-25 LAB — HIGH SENSITIVITY TROPONIN I - SERIAL: HIGH SENSITIVITY TROPONIN I: <3 ng/L (ref <=53)

## 2022-04-25 LAB — PROTIME-INR
INR: 1.12
PROTIME: 12.4 s (ref 9.9–12.6)

## 2022-04-25 LAB — B-TYPE NATRIURETIC PEPTIDE: B-TYPE NATRIURETIC PEPTIDE: 12.42 pg/mL (ref <=100)

## 2022-04-25 LAB — HIGH SENSITIVITY TROPONIN I - 2 HOUR SERIAL
HIGH SENSITIVITY TROPONIN - DELTA (0-2H): 0 ng/L (ref <=7)
HIGH-SENSITIVITY TROPONIN I - 2 HOUR: <3 ng/L (ref <=53)

## 2022-04-25 LAB — HIGH SENSITIVITY TROPONIN I - 6 HOUR SERIAL
HIGH SENSITIVITY TROPONIN - DELTA (2-6H): 0 ng/L (ref <=7)
HIGH-SENSITIVITY TROPONIN I - 6 HOUR: <3 ng/L (ref <=53)

## 2022-04-25 MED ORDER — PREDNISONE 20 MG TABLET
ORAL_TABLET | Freq: Every day | ORAL | 0 refills | 5 days | Status: CP
Start: 2022-04-25 — End: 2022-04-30

## 2022-04-25 MED ADMIN — aspirin chewable tablet 324 mg: 324 mg | ORAL | @ 21:00:00 | Stop: 2022-04-25

## 2022-04-25 NOTE — Unmapped (Signed)
Pt present here with SOB, pain to ribs, coughing and chest tightness for 3 weeks. H/o Stents x 3

## 2022-04-25 NOTE — Unmapped (Signed)
Upcoming Appt:  Future Appointments   Date Time Provider Department Center   04/25/2022  4:00 PM [facility] URGENT [facility]ARE AT [facility]ER [facility] [facility]   05/12/2022 10:30 AM PFT 2 [facility] [place] [facility]   07/23/2022  1:00 PM Claris Gower, AGNP CARD [facility] [facility]   09/24/2022 12:00 PM Baxter Kail, MD [facility] [place] [facility]       Recent:   What is the date of your last related visit?  Last seen by Cardiologist 04/22/22  Related acute medications Rx'd:  na  Home treatment tried:  Albuterol in haler 2 puffs at 2:30 pm, Neb tx at 5 am      Relevant:   Allergies: Lopid [gemfibrozil], Metoprolol, Statins-hmg-coa reductase inhibitors, and Lexapro [escitalopram oxalate]  Medications: Afrin at HS, uses 3 times a week, Zyrtec, plavix  Health History: COPD, HTN, CAD  Weight: na      [place] [facility] patients only:  What was the date of your last cancer treatment (mm/dd/yy)?: na  Was the treatment oral or infusion?: na  Are you currently on TVEC (yes/no)?: na  Reason for Disposition   Severe difficulty breathing (e.g., struggling for each breath, speaks in single words)    Answer Assessment - Initial Assessment Questions  1. RESPIRATORY STATUS: Describe your breathing? (e.g., wheezing, shortness of breath, unable to speak, severe coughing)       SOB, coughing and severe  2. ONSET: When did this breathing problem begin?       3 weeks ago but worst in last week  3. PATTERN Does the difficult breathing come and go, or has it been constant since it started?       constant  4. SEVERITY: How bad is your breathing? (e.g., mild, moderate, severe)     - MILD: No SOB at rest, mild SOB with walking, speaks normally in sentences, can lie down, no retractions, pulse < 100.     - MODERATE: SOB at rest, SOB with minimal exertion and prefers to sit, cannot lie down flat, speaks in phrases, mild retractions, audible wheezing, pulse 100-120.     - SEVERE: Very SOB at rest, speaks in single words, struggling to breathe, sitting hunched forward, retractions, pulse > 120       Sevre- Can't lay flat, SOB at rest, tight in ribs and cheat. Pulse is 95 and O2 sat is 93 % ra- normal is 95-96 % ra. Sitting on side of bed and feels like he is struggling to breath.   5. RECURRENT SYMPTOM: Have you had difficulty breathing before? If Yes, ask: When was the last time? and What happened that time?       Yes, but not like now - this is worst it has ever bine. Was on steroids 3 weeks ago and finished them 2 weeks ago  6. CARDIAC HISTORY: Do you have any history of heart disease? (e.g., heart attack, angina, bypass surgery, angioplasty)       Says has frequent chest pain and 3 stents put in  7. LUNG HISTORY: Do you have any history of lung disease?  (e.g., pulmonary embolus, asthma, emphysema)      COPD  8. CAUSE: What do you think is causing the breathing problem?       COPD exacerbation  9. OTHER SYMPTOMS: Do you have any other symptoms? (e.g., dizziness, runny nose, cough, chest pain, fever)     Cough- Dizziness when coughs. Chest is tight. No fever. Has a runny nose and stopped up.  10. O2 SATURATION MONITOR:  Do you use an oxygen saturation monitor (pulse oximeter) at home? If Yes, ask: What is your reading (oxygen level) today? What is your usual oxygen saturation reading? (e.g., 95%)        93% ra  11. PREGNANCY: Is there any chance you are pregnant? When was your last menstrual period?        na  12. TRAVEL: Have you traveled out of the country in the last month? (e.g., travel history, exposures)        no    Protocols used: Breathing Difficulty-A-AH

## 2022-04-25 NOTE — Unmapped (Signed)
Copied from CRM #[id number]. Topic: HL - Post Triage Scheduling Support - Call 911  >> Apr 25, 2022  3:06 PM Carter Kitten wrote:  Post-triage support request related to Urgent Care Initiated Triage  Primary Complaint/Symptom: SOB  PASS Support Need: Cancel appointment    Advised pt to call 911, he stated he was going to try and get a ride to ER first

## 2022-04-25 NOTE — Unmapped (Signed)
Regarding: UC- shortness of breath, trouble breathing  ----- Message from Rogelio Seen sent at 04/25/2022  2:51 PM EST -----  If you had not called [facility], what do you think you would have done?   Go to Urgent Care / Clinic Walk-in Hours

## 2022-04-26 MED ADMIN — ipratropium-albuteroL (DUO-NEB) 0.5-2.5 mg/3 mL nebulizer solution 3 mL: 3 mL | RESPIRATORY_TRACT | @ 01:00:00 | Stop: 2022-04-25

## 2022-04-26 MED ADMIN — predniSONE (DELTASONE) tablet 60 mg: 60 mg | ORAL | @ 03:00:00 | Stop: 2022-04-25

## 2022-04-26 NOTE — Unmapped (Signed)
[facility]  Emergency Department Provider Note    ED Clinical Impression     Final diagnoses:   COPD exacerbation (CMS-HCC) (Primary)       HPI, ED Course, Assessment and Plan     Initial Clinical Impression:    April 25, 2022 5:23 PM   Chase Mendoza is a 56 y.o. male with past medical history of CAD s/p PCI, HTN, HLD and COPD presenting with shortness of breath. The patient reports 3 weeks of shortness of breath, cough and chest tightness. He has used his inhalers (albuterol and trelegy) and his nebulizer with no symptomatic relief. He denies fever.    BP 110/79  - Pulse 84  - Temp 36.6 ??C (97.9 ??F) (Oral)  - Resp 14  - SpO2 97%     Medical Decision Making    Further ED updates and updates to plan as per ED Course below:    ED Course:  ED Course as of 04/25/22 2151   Sat Apr 25, 2022   3829 56 year old male with history as below including COPD here with shortness of breath, chest tightness and cough which has been worsening over the past 3 weeks after running out of his prednisone inhaler.  High suspicion for COPD exacerbation, possibly in the setting of viral illness or pneumonia.  Given his symptoms, also considered ACS, arrhythmia.  Labs, EKG, chest x-ray to evaluate.  Will trial DuoNeb and evaluate for improvement, suspect he will require steroids.   2145 XR Chest 2 views  IMPRESSION:     Hyperinflation with otherwise clear lungs.     2145 RAPID INFLUENZA/RSV/COVID PCR:    SARS-CoV-2 PCR Negative   Influenza A Negative   Influenza B Negative   RSV Negative  Viral swabs negative   2145 hsTroponin I: <3  Negative troponin x3   2145 BNP: 12.42  No elevation in BNP, not concern for volume overload   2148 Patient states he is feeling better and request discharge..  We will give a dose of prednisone here and sent a prescription for a course of prednisone.  Discussed the importance of adhering to his other medications, following with his primary care doctor, and discuss strict return precautions including any respiratory distress or worsening difficulty breathing.  All questions answered, will discharge.       Social Determinants of Health with Concerns     Internet Connectivity: Not on file   Food Insecurity: Unknown (05/12/2019)    Hunger Vital Sign     Worried About Running Out of Food in the Last Year: Patient refused     Ran Out of Food in the Last Year: Patient refused   Tobacco Use: High Risk (04/25/2022)    Patient History     Smoking Tobacco Use: Every Day     Smokeless Tobacco Use: Current     Passive Exposure: Not on file   Housing/Utilities: Unknown (09/14/2020)    Housing/Utilities     Within the past 12 months, have you ever stayed: outside, in a car, in a tent, in an overnight shelter, or temporarily in someone else's home (i.e. couch-surfing)?: No     Are you worried about losing your housing?: Not on file     Within the past 12 months, have you been unable to get utilities (heat, electricity) when it was really needed?: Not on file   Alcohol Use: Not on file   Transportation Needs: Unknown (05/12/2019)    PRAPARE - Transportation  Lack of Transportation (Medical): Patient refused     Lack of Transportation (Non-Medical): Patient refused   Substance Use: Not on file   Physical Activity: Not on file   Interpersonal Safety: Not on file   Stress: Not on file   Intimate Partner Violence: Not on file   Depression: Not on file   Social Connections: Not on file     _____________________________________________________________________    The case was discussed with the attending physician who is in agreement with the above assessment and plan    Past History     PAST MEDICAL HISTORY/PAST SURGICAL HISTORY:   Past Medical History:   Diagnosis Date    Blood in stool     Colon polyps 2016    adenomatous polyp    Constipation     COPD (chronic obstructive pulmonary disease) (CMS-HCC)     Diarrhea     Diverticulosis of colon     GERD (gastroesophageal reflux disease) 2016    esophagitis    Hyperlipidemia Hypertension     Kidney stones     Migraines        Past Surgical History:   Procedure Laterality Date    PR CATH PLACE/CORON ANGIO, IMG SUPER/INTERP,W LEFT HEART VENTRICULOGRAPHY N/A 04/02/2017    Procedure: CATH LEFT HEART CATHETERIZATION W INTERVENTION;  Surgeon: Alvira Philips, MD;  Location: [facility] CATH;  Service: Cardiology    PR COLONOSCOPY W/BIOPSY SINGLE/MULTIPLE N/A 07/05/2014    Procedure: COLONOSCOPY, FLEXIBLE, PROXIMAL TO SPLENIC FLEXURE; WITH BIOPSY, SINGLE OR MULTIPLE;  Surgeon: Dewaine Conger, MD;  Location: [facility] GI PROCEDURES [place];  Service: Gastroenterology    PR ESOPHAGEAL MOTILITY STUDY, MANOMETRY N/A 07/04/2015    Procedure: ESOPHAGEAL MOTILITY STUDY W/INT & REP;  Surgeon: Nurse-Based Giproc;  [facility]: [facility];  Service: Gastroenterology    PR GERD TST W/ MUCOS IMPEDE ELECTROD,>1HR N/A 07/04/2015    Procedure: ESOPHAGEAL FUNCTION TEST, GASTROESOPHAGEAL REFLUX TEST W/ NASAL CATHETER INTRALUMINAL IMPEDANCE ELECTRODE(S) PLACEMENT, RECORDING, ANALYSIS AND INTERPRETATION; PROLONGED;  Surgeon: Nurse-Based Giproc;  Location: [facility];  Service: Gastroenterology    PR UP GI ENDOSCOPY,BALL DIL,30MM N/A 07/05/2014    Procedure: UGI ENDO; W/BALLOON DILAT ESOPHAGUS (<30MM DIAM);  Surgeon: Dewaine Conger, MD;  Location: [facility];  Service: Gastroenterology    PR UP GI ENDOSCOPY,BALL DIL,30MM N/A 02/01/2018    Procedure: UGI ENDO; W/BALLOON DILAT ESOPHAGUS (<30MM DIAM);  Surgeon: Leland Her, MD;  [facility]: [facility];  Service: Gastroenterology    PR UP GI ENDOSCOPY,DILATN W GUIDE N/A 11/16/2014    Procedure: UGI ENDOSCOPY; WITH INSERTION OF GUIDE WIRE FOLLOWED BY DILATION OF ESOPHAGUS OVER GUIDE WIRE;  Surgeon: Erskin Burnet, MD;  Location: GI PROCEDURES [facility] [facility];  Service: Gastroenterology    PR UPPER GI ENDOSCOPY,BIOPSY N/A 07/05/2014    Procedure: UGI ENDOSCOPY; WITH BIOPSY, SINGLE OR MULTIPLE;  Surgeon: Dewaine Conger, MD;  Location: [facility];  Service: Gastroenterology    PR UPPER GI ENDOSCOPY,W/DIR SUBMUC INJ N/A 02/01/2018    Procedure: UGI ENDO, INCL ESOPH/STOMACH/& EITHER DUODENUM AND/OR JEJUNUM AS APPROP; WITH DIRECTED SUBMUCOSAL INJECTION;  Surgeon: Leland Her, MD;  Location: [facility];  Service: Gastroenterology    SALIVARY GLAND SURGERY         MEDICATIONS:     Current Facility-Administered Medications:     predniSONE (DELTASONE) tablet 60 mg, 60 mg, Oral, Once, Raechel Ache, MD    Current Outpatient  Medications:     ADDERALL XR 30 mg 24 hr capsule, , Disp: , Rfl:     albuterol HFA 90 mcg/actuation inhaler, Inhale 2 puffs every six (6) hours as needed for wheezing., Disp: 8.5 g, Rfl: 2    atorvastatin (LIPITOR) 80 MG tablet, TAKE ONE (1) TABLET BY MOUTH ONCE DAILY, Disp: 90 tablet, Rfl: 0    carvediloL (COREG) 12.5 MG tablet, Take 1 tablet (12.5 mg total) by mouth Two (2) times a day., Disp: 180 tablet, Rfl: 3    cetirizine (ZYRTEC) 10 MG tablet, , Disp: , Rfl:     clopidogreL (PLAVIX) 75 mg tablet, Take 1 tablet (75 mg total) by mouth daily., Disp: 90 tablet, Rfl: 3    dextroamphetamine-amphetamine (ADDERALL) 20 mg tablet, , Disp: , Rfl:     empty container (SHARPS-A-GATOR DISPOSAL SYSTEM) Misc, Use as directed for sharps disposal, Disp: 1 each, Rfl: 2    evolocumab (REPATHA SURECLICK) 140 mg/mL PnIj, Inject the contents of one pen (140 mg) under the skin every fourteen (14) days., Disp: 2 mL, Rfl: 5    ezetimibe (ZETIA) 10 mg tablet, TAKE ONE TABLET BY MOUTH DAILY., Disp: 90 tablet, Rfl: 0    fluticasone propionate (FLONASE) 50 mcg/actuation nasal spray, , Disp: , Rfl:     fluticasone-umeclidin-vilanter (TRELEGY ELLIPTA) 100-62.5-25 mcg inhaler, Inhale 1 puff daily., Disp: 60 each, Rfl: 5    ipratropium-albuteroL (DUO-NEB) 0.5-2.5 mg/3 mL nebulizer, USE 1 VIAL VIA NEBULIZER EVERY 6 HOURS AS NEEDED., Disp: 360 mL, Rfl: 6    nitroglycerin (NITROSTAT) 0.4 MG SL tablet, PLACE 1 TABLET UNDER THE TONGUE EVERY 5 MINUTES AS NEEDED FOR CHEST PAIN. MAX OF3 DOSES IN 15 MINUTES., Disp: 25 each, Rfl: 0    omeprazole (PRILOSEC) 20 MG capsule, TAKE (1) CAPSULE BY MOUTH TWICE DAILY BEFORE MEALS, Disp: 180 capsule, Rfl: 1    predniSONE (DELTASONE) 20 MG tablet, Take 2 tablets (40 mg total) by mouth daily for 5 days., Disp: 10 tablet, Rfl: 0    QUEtiapine (SEROQUEL) 100 MG tablet, , Disp: , Rfl:     tamsulosin (FLOMAX) 0.4 mg capsule, Take by mouth., Disp: , Rfl:     ALLERGIES:   Lopid [gemfibrozil], Metoprolol, Statins-hmg-coa reductase inhibitors, and Lexapro [escitalopram oxalate]    SOCIAL HISTORY:   Social History     Tobacco Use    Smoking status: Every Day     Packs/day: 0.50     Years: 40.00     Additional pack years: 0.00     Total pack years: 20.00     Types: Cigarettes    Smokeless tobacco: Current     Types: Snuff    Tobacco comments:     pt reports that he is down to 6 cigs/day- after meals   Substance Use Topics    Alcohol use: No     Alcohol/week: 0.0 standard drinks of alcohol       FAMILY HISTORY:  Family History   Problem Relation Age of Onset    Colorectal Cancer Neg Hx     Crohn's disease Neg Hx     Ulcerative colitis Neg Hx           Review of Systems     A review of systems was performed and relevant portions were as noted above in HPI     Physical Exam     VITAL SIGNS:    BP 110/79  - Pulse 84  - Temp 36.6 ??C (97.9 ??F) (Oral)  - Resp  14  - SpO2 97%       Constitutional:   Alert and oriented.  Appears not to feel well, visibly dyspneic  Head:   Normocephalic and atraumatic  Eyes:   Conjunctivae are normal, EOMI, PERRL  ENT:   No notable congestion, Mucous membranes moist, External ears normal, no notable stridor  Cardiovascular:   Rate as vitals above. Appears warm and well perfused  Respiratory:   Normal respiratory effort.  Expiratory wheezing throughout  Gastrointestinal:   Soft, non-distended, and nontender.   Genitourinary:   Deferred  Musculoskeletal: Normal range of motion in all extremities. No tenderness or edema noted in B/L lower extremities  Neurologic:   No gross focal neurologic deficits beyond baseline are appreciated.  Skin:   Skin is warm, dry and intact.       Radiology     XR Chest 2 views   Final Result      Hyperinflation with otherwise clear lungs.          Labs     Labs Reviewed   COMPREHENSIVE METABOLIC PANEL - Abnormal; Notable for the following components:       Result Value    BUN 5 (*)     All other components within normal limits   CBC W/ AUTO DIFF - Abnormal; Notable for the following components:    WBC 15.8 (*)     Absolute Neutrophils 9.4 (*)     Absolute Lymphocytes 4.9 (*)     Absolute Monocytes 1.0 (*)     All other components within normal limits   INFLUENZA/RSV/COVID PCR - Normal    Narrative:     This test was performed using the Cepheid Xpert Xpress SARS-CoV-2/Flu/RSV plus assay, which has been validated by the CLIA-certified, CAP-inspected [facility]. FDA has granted Emergency Use Authorization for this test. Negative results do not preclude infection and should be interpreted along with clinical observations, patient history, and epidemiological information. Information for providers and patients can be found here: [email or web address]   HIGH SENSITIVITY TROPONIN I - SERIAL - Normal   HIGH SENSITIVITY TROPONIN I - 2 HOUR SERIAL - Normal   B-TYPE NATRIURETIC PEPTIDE - Normal   HIGH SENSITIVITY TROPONIN I - 6 HOUR SERIAL - Normal   CBC W/ DIFFERENTIAL    Narrative:     The following orders were created for panel order CBC w/ Differential.                  Procedure                               Abnormality         Status                                     ---------                               -----------         ------                                     CBC w/ Differential[[id number]]         Abnormal  Final result                                                 Please view results for these tests on the individual orders.   PROTIME-INR         Pertinent labs & imaging results that were available during my care of the patient were reviewed by me and considered in my medical decision making (see chart for details).    Please note- This chart has been created using [employer]. Chart creation errors have been sought, but may not always be located and such creation errors, especially pronoun confusion, do NOT reflect on the standard of medical care.    Documentation assistance was provided by Desma Paganini, Scribe, on April 25, 2022 at 5:23 PM for Elyse Hsu, MD.      Documentation assistance was provided by the scribe in my presence.  The documentation recorded by the scribe has been reviewed by me and accurately reflects the services I personally performed.           Raechel Ache, MD  Resident  04/25/22 [phone]

## 2022-05-12 ENCOUNTER — Ambulatory Visit: Admit: 2022-05-12 | Discharge: 2022-05-13 | Payer: PRIVATE HEALTH INSURANCE

## 2022-05-15 NOTE — Unmapped (Signed)
[facility] Pharm[facility] Refill Coordination Note    Specialty Medication(s) to be Shipped:   General Specialty: Repatha    Other medication(s) to be shipped: No additional medications requested for fill at this time     Chase Mendoza, DOB: 19-Apr-1966  Phone: [phone] (home)       All above HIPAA information was verified with patient.     Was a Nurse, learning disability used for this call? No    Completed refill call assessment today to schedule patient's medication shipment from the [facility] [phone]).  All relevant notes have been reviewed.     Specialty medication(s) and dose(s) confirmed: Regimen is correct and unchanged.   Changes to medications: [name] reports no changes at this time.  Changes to insurance: No  New side effects reported not previously addressed with a pharmacist or physician: None reported  Questions for the pharmacist: No    Confirmed patient received a Conservation officer, historic buildings and a Surveyor, mining with first shipment. The patient will receive a drug information handout for each medication shipped and additional FDA Medication Guides as required.       DISEASE/MEDICATION-SPECIFIC INFORMATION        For patients on injectable medications: Patient currently has 1 doses left.  Next injection is scheduled for 05/17/22.    SPECIALTY MEDICATION ADHERENCE     Medication Adherence    Patient reported X missed doses in the last month: 0  Specialty Medication: Repatha 140mg /ml  Patient is on additional specialty medications: No                                Were doses missed due to medication being on hold? No    Repatha 140 mg/ml: 2 days of medicine on hand     REFERRAL TO PHARMACIST     Referral to the pharmacist: Not needed      [facility]     Shipping address confirmed in Epic.     Delivery Scheduled: Yes, Expected medication delivery date: 05/20/22.     Medication will be delivered via UPS to the prescription address in Epic WAM.    Tera Helper, [facility]   [facility] Specialty Pharmacist

## 2022-05-19 MED FILL — REPATHA SURECLICK 140 MG/ML SUBCUTANEOUS PEN INJECTOR: SUBCUTANEOUS | 28 days supply | Qty: 2 | Fill #4

## 2022-06-04 ENCOUNTER — Ambulatory Visit: Admit: 2022-06-04 | Discharge: 2022-06-05 | Payer: PRIVATE HEALTH INSURANCE

## 2022-06-04 DIAGNOSIS — J449 Chronic obstructive pulmonary disease, unspecified: Principal | ICD-10-CM

## 2022-06-25 NOTE — Unmapped (Signed)
[facility] Special[facility] Pharm[facility] Refill Coordination Note    Specialty Lite Medication(s) to be Shipped:   General Specialty: Repatha    Other medication(s) to be shipped: No additional medications requested for fill at this time     JAQUANTE KIRSCHENMANN, DOB: [birth year]  Phone: [phone] (home)       All above HIPAA information was verified with patient.     Was a Nurse, learning disability used for this call? No    Changes to medications: [place] reports no changes at this time.  Changes to insurance: Yes: New plan on file. Added copay card as well.      REFERRAL TO PHARMACIST     Referral to the pharmacist: Not needed      [facility]     Shipping address confirmed in Epic.     Delivery Scheduled: Yes, Expected medication delivery date: 07/02/22.     Medication will be delivered via UPS to the prescription address in Epic WAM.    Camillo Flaming, PharmD   [facility] Specialty Pharmacist

## 2022-07-01 MED FILL — REPATHA SURECLICK 140 MG/ML SUBCUTANEOUS PEN INJECTOR: SUBCUTANEOUS | 28 days supply | Qty: 2 | Fill #5

## 2022-07-23 ENCOUNTER — Ambulatory Visit
Admit: 2022-07-23 | Discharge: 2022-07-24 | Payer: PRIVATE HEALTH INSURANCE | Attending: Adult Health | Primary: Adult Health

## 2022-07-23 DIAGNOSIS — I2510 Atherosclerotic heart disease of native coronary artery without angina pectoris: Principal | ICD-10-CM

## 2022-07-23 DIAGNOSIS — J42 Unspecified chronic bronchitis: Principal | ICD-10-CM

## 2022-07-23 DIAGNOSIS — Z720 Tobacco use: Principal | ICD-10-CM

## 2022-07-23 DIAGNOSIS — E7801 Familial hypercholesterolemia: Principal | ICD-10-CM

## 2022-07-23 DIAGNOSIS — I10 Essential (primary) hypertension: Principal | ICD-10-CM

## 2022-07-23 NOTE — Unmapped (Signed)
DIVISION OF CARDIOLOGY   [facility], [place]                                                                         Date of Service:  07/23/2022     Assessment/Plan:   1. CAD  s/p PCI to RCA (03/2017) with 50% mLCX lesion which has been medically managed. Had progressive anginal sx and underwent PCI of ISR of mRCA stent at [facility] 04/14/21 (due to insurance). LCx dz similar to prior.  Did not tolerate imdur (HA). Stable, no angina. Echo w/ normal LV & RV function.   - ASA 81 mg daily, prasugrel 10 mg daily>> recently switched to plavix monotherapy but developed rash. Will switch to ASA monotherapy.   - carvedilol 12.5 mg BID  - lipid mgmt as per below    2. Essential hypertension  Well controlled     3. Tobacco abuse disorder  1/2 ppd. I counseled him on the importance of quitting smoking but he remains precontemplative.      4. Mixed hyperlipidemia  Continue atorvastatin 80, Zetia 10 mg.   Has had prior LDL>200, c/f familial HLD  LDL now at goal <70    Lab Results   Component Value Date    LDL 51.0 04/22/2022     5. COPD  moderate to severe COPD, cont'd smoking. Followed by pulmonology (Dr. Orson Aloe)    Return to clinic: Return in about 6 months (around 01/21/2023).    I personally spent 31 minutes face-to-face and non-face-to-face in the care of this patient, which includes all pre, intra, and post visit time on the date of service.      Subjective:   [facility] [place]  Patient ID: Chase Mendoza is an 57 y.o. male patient with CAD s/p PCI of RCA (04/2021), HTN, familial HLD, tobacco use, COPD, GERD who presents for follow-up.    History present illness:  Mr. Willig is s/p PCI to RCA (03/2017) with 50% mLCX lesion which has been medically managed. Had progressive anginal sx and underwent PCI of ISR of mRCA stent at [facility] 04/14/21 (due to insurance). LCx dz similar to prior.    Last seen in November by myself. Had excellent reduction of LDL w/ Repatha. Switched to plavix monotherapy.       Here today for f/up.   Had itchy rash shortly after starting plavix. Has cleared up with hydroxyzine.   No recent chest pain. No swelling, palpitations, LH/dizziness.   Had sleep study showing mild OSA (moderate in REM) -  scheduled for cpap titration  Had recent COPD exacerbation, treated w/ prednisone.   Not ready to quit smoking  Home BP 130/80    Continues to work at [employer] when he can  Mother and brother both died of MI.     Objective:   Physical Exam:  BP 138/85 (BP Site: R Arm, BP Position: Sitting, BP Cuff Size: Medium)  - Pulse 83  - Wt 91.3 kg (201 lb 3.2 oz)  - SpO2 99%  - BMI 26.55 kg/m??   General-  Normal appearing male in no apparent distress.  Neurologic- Alert and oriented X3.  Cranial nerve II-XII grossly intact.  HEENT-  Normocephalic  atraumatic head.  No scleral icterus.  MMM  Neck- Supple, no carotid bruis, no JVD  Lungs- Clear to auscultation, no wheezes, rhonchi, or rhales. +cougiing  Heart- RRR, nl S1S2, no MRG  Extremities-  No clubbing or cyanosis.  No LE edema  Pulses- +2 pulses in radial bilaterally.  Psych- Normal mood, appropriate.     Notable results:  Labs:   Lab Results   Component Value Date    WBC 15.8 (H) 04/25/2022    HGB 13.5 04/25/2022    HCT 40.4 04/25/2022    PLT 273 04/25/2022     Lab Results   Component Value Date    NA 138 04/25/2022    K 3.7 04/25/2022    CREATININE 0.86 04/25/2022      Lab Results   Component Value Date    CHOL 146 06/26/2021    LDL 51.0 04/22/2022    HDL 27 (L) 06/26/2021    TRIG 177 (H) 06/26/2021     Lab Results   Component Value Date    A1C 5.7 (H) 05/13/2019      Imaging/Other:  Electrocardiogram:    From 10/16/21 showed NSR.      Echo:  From 05/29/17 showed normal left ventricular systolic function, ejection fraction > 55%. Normal right ventricular systolic function. No significant valvular abnormalities.    From 08/25/21 showed LVEF 55%. The right ventricle is not well visualized but probably normal in size, with normal systolic function.     Nuclear Stress Test:  From 10/2017 was a normal myocardial perfusion study. No evidence for significant ischemia or scar noted. Post-stress EF > 65%.     From 05/26/19 showed normal myocardial perfusion study. No evidence for significant ischemia or scar is noted. Post stress: Global systolic function is normal. The ejection fraction calculated at 60%. Attenuation CT scan shows post PCI findings. CT finding show emphysematous and non-specific ground glass changes of the lungs.     Cardiac Catheterization:  From 03/2017 showed normal LV systolic function. Coronary artery disease including very long 50% mid-RCA stenosis (FFR = 0.80) and 50% mid-circumflex stenosis. Successful PCI of mid-RCA with placement of an Onyx drug eluting stent.     From [facility] 04/14/21 showed 2 vessel coronary artery disease.   Successful PCI of mid RCA. LCx disease is similar to 2018   ISR of mid RCA was target for intervention. 2.25 x 12-mm PTCA balloon, HD IVUS used for optimization. Prior stents were expanded and apposed. Diffuse ISR. 3.0 x 28-mm Synergy DES at 16 atm. 3.0 x 20-mm Aberdeen Emerge used to post-dilate.        Glade Stanford, AGNP-C  Cardiology Nurse Practitioner  [facility]

## 2022-07-23 NOTE — Unmapped (Signed)
Stop clopidogrel & hydroxyzine    Start baby aspirin

## 2022-08-13 DIAGNOSIS — I2510 Atherosclerotic heart disease of native coronary artery without angina pectoris: Principal | ICD-10-CM

## 2022-08-13 DIAGNOSIS — E782 Mixed hyperlipidemia: Principal | ICD-10-CM

## 2022-08-13 MED ORDER — REPATHA SURECLICK 140 MG/ML SUBCUTANEOUS PEN INJECTOR
SUBCUTANEOUS | 5 refills | 28 days | Status: CP
Start: 2022-08-13 — End: ?
  Filled 2022-08-19: qty 2, 28d supply, fill #0

## 2022-08-14 NOTE — Unmapped (Signed)
[facility] Refill Coordination Note    Specialty Lite Medication(s) to be Shipped:   General Specialty: Repatha    Other medication(s) to be shipped: No additional medications requested for fill at this time     DEXTON ATHEY, DOB: 06-28-1965  Phone: [phone] (home)       All above HIPAA information was verified with patient.     Was a Nurse, learning disability used for this call? No    Changes to medications: Jose reports no changes at this time.  Changes to insurance: No      REFERRAL TO PHARMACIST     Referral to the pharmacist: Not needed      [facility]     Shipping address confirmed in Epic.     Delivery Scheduled: Yes, Expected medication delivery date: 03/14.     Medication will be delivered via UPS to the prescription address in Epic WAM.    Antonietta Barcelona   [facility] Specialty Technician

## 2022-09-02 ENCOUNTER — Ambulatory Visit: Admit: 2022-09-02 | Discharge: 2022-09-02 | Payer: PRIVATE HEALTH INSURANCE

## 2022-09-21 ENCOUNTER — Ambulatory Visit: Admit: 2022-09-21 | Discharge: 2022-09-22 | Payer: PRIVATE HEALTH INSURANCE

## 2022-09-21 DIAGNOSIS — N410 Acute prostatitis: Principal | ICD-10-CM

## 2022-09-21 DIAGNOSIS — N400 Enlarged prostate without lower urinary tract symptoms: Principal | ICD-10-CM

## 2022-09-21 MED ORDER — TAMSULOSIN 0.4 MG CAPSULE
ORAL_CAPSULE | Freq: Every day | ORAL | 3 refills | 45.00000 days | Status: CP
Start: 2022-09-21 — End: 2022-09-21

## 2022-09-21 NOTE — Unmapped (Addendum)
-   if you would like more viagra, please have cardiology state that it's ok for me to prescribe  - continue flomax  - send psa results

## 2022-09-21 NOTE — Unmapped (Addendum)
Assessment:  Microhematuria  BPH  Hx of prostatitis  ED     Plan:  - discussed viagra and current contraindication given nitroglycerin prescription. He's going to talk to cardiology regarding clearance  - will continue flomax for bph  - psa today (or if he has had this more recently, he will fax [place] the results. He's going to check).  - offered microhematuria work up with ctu/ cytoscopy, but he declined. Understands the risk of missing underlying malignancy         Fyi - I'm fine with viagra     Cassandra Ellwood Handler, AGNP  Chrisotpher Rivero Daniel4 hours ago (10:46 AM)       Viagra is fine - you just can't use nitroglycerin 24 hrs after taking it. I sent a separate message to your urology provider.       HPI:   57 y.o. male here for Routine f/u    Saw Stoioff 01/25/17.  Recurrent stone disease, recently passed right ureteral calculus.  CT showed no remaining calculi.  Recommended metabolic evaluation, FU in 6 months for KUB.    Interval history:  Did not f/u as scheduled but referred here by Dr. Minerva Areola for abdominal pain and heme+UA  Pt has previously stated that he believes his pain may be due to the amount of medication he is taking. When he misses taking medication at night, he feels better the next morning. Pain is located in his gut and is exacerbated by eating. Endorses pain when he doesn't eat as well.  UA 09/24/17 1+ heme, no micro. No nit/LE.  UA 08/26/17 1+ heme, no micro. No nit/LE.    Trouble urinating all the time. Been going on for years.  Urine stinks some times.    PMH: COPD, heart stents, stomach problems, high blood pressure, high cholesterol  No EtOH  Smokes 6 cigarettes/day 40 years    CT abd/pelvis without 08/21/17: neg    Had what he thinks was a cysto 3-4 years ago. Reportedly normal.    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 No known occupational exposure.    UA today: 2+ blood    Interval 09/21/22:       He reports that in March he had a prostatitis episode. He notes he was having pain in his prostate and microscopic hematuria as well as testicular discomfort. He did get treated with antibiotics and reports his symptoms resolved.     NO LUTs currently. He states typically only has issues with urination if he's constipated. He is on flomax. Stopped finasteride due to migraines.     States he did a cystoscopy a couple of years ago while he was in prison. Declines repeat testing.     Had been on subutex in the past, but discontinued due to constipation.     Does have a script for nitroglycerin, and states he has mild ED.       UA trace blood   PVR 0 mls     From PCP:   #hematuria - prostatitis  finished abx: yes - finished 5-6 days ago  stopped the finasteride bc thinks it was contributing to HA/BP/'man problems'  [ ]  PSA, BMP, a1c @ next visit      Past history reviewed and unchanged    ROS:   A comprehensive 10-system review was negative, except as noted in HPI.  The patient was asked to review all abnormal responses not pertinent to today's visit with their primary care physician.  BP 130/80  - Pulse 80  - Temp 36.3 ??C (97.3 ??F) (Temporal)  - Ht 185.4 cm (6' 0.99)  - Wt 89.8 kg (198 lb)  - BMI 26.13 kg/m??     Physical Exam:    General: well developed, well nourished, no acute distress  HEENT: PERLA, EOM intact, normocephalic, atraumatic  Neck: supple and no masses  Chest: symmetrical  Lungs: non-labored breathing  Heart: normal rhythm, no JVD  Abdomen: no tenderness, no masses or hernias, no palpable organomegaly  GU: declined   Extremities: no deformities, no edema, no cyanosis

## 2022-09-22 MED ORDER — SILDENAFIL 100 MG TABLET
ORAL_TABLET | Freq: Every day | ORAL | 11 refills | 10 days | Status: CP | PRN
Start: 2022-09-22 — End: 2023-09-17

## 2022-09-22 NOTE — Unmapped (Signed)
Addended by: Nilda Simmer A on: 09/22/2022 03:38 PM     Modules accepted: Orders

## 2022-09-22 NOTE — Unmapped (Signed)
[facility] Specialty Pharmacy Refill Coordination Note    Specialty Lite Medication(s) to be Shipped:   General Specialty: Repatha    Other medication(s) to be shipped: No additional medications requested for fill at this time     SHIGEKI FURNO, DOB: Dec 07, 1965  Phone: [phone] (home) [phone] (work)      All above HIPAA information was verified with patient.     Was a Nurse, learning disability used for this call? No    Changes to medications: Jad reports no changes at this time.  Changes to insurance: No      REFERRAL TO PHARMACIST     Referral to the pharmacist: Not needed      [facility]     Shipping address confirmed in Epic.     Delivery Scheduled: Yes, Expected medication delivery date: 09/25/22.     Medication will be delivered via UPS to the prescription address in Epic WAM.    Hoke Baer' W Danae [facility] Specialty Technician

## 2022-09-23 NOTE — Unmapped (Unsigned)
PRE-CHARTING ONLY.  FINAL DOCUMENT PENDING CLINIC VISIT        Pulmonary Clinic - Return Visit    Referring Physician :  Baxter Kail  PCP:     [facility], [place]  Reason for Consult:   COPD    HISTORY:     History of Present Illness:  Chase Mendoza is a 57 y.o. male with a history of COPD, CAD s/p PCI to RCA in 2018, HTN, HLD, GERD, renal stones, persistent tobacco use (cigarettes)  whom we are seeing in consultation requested by Baxter Kail for evaluation of COPD management and worsening shortness of breath.    Pertinent History/Chart Review   Chase Mendoza was 1st seen in March 2023 by our pulmonary clinic. He presented with SOB with minimal activity, productive cough (white/clear), dizzy, wakes up at night bc can't breathe. His symptoms improved some with duo neb, more with Trelegy. He is a current smoker (20 pk/yr) who is trying to quit. Medication selection and treatment options limited by finances. He was seen by Cardiology 10/16/2021 history notable for fatigue, anemia, and leukocytosis, CBC was normal in May.  All of his w/u has been negative to date (other than emphysema) wtih the primary complaint being fatigue    Interim History    [facility] ED in november with COPD exacerbation; given course of steroids.  Remains on Repatha with good lipid panel    Sleep study march 27 (Titration study):  needs CPAP at 12, medium mask.  Encouraged smoking cessation        He did pulm rehab on line but it wasn't working for him so he stopped it.  It was too much and made his back hurt.    He isn't doing much    He usually takes seroquel to help him sleep as without it he won't go to sleep.  He is scheduled for a sleep study in November.    He continues to smoke 6-8 cigarettes per day.  He has tried patches, gum.  Chantix made him more depressed. Wellbrutirn did the same.      He has not experienced fever, chills     Most recent MMRC dyspnea scale: Not Found Last [facility] date: Not Found    Date CAT [facility] March 29, 2019  1    August 28, 2021  19     December 18, 2021 21               Past Medical History:  Past Medical History:   Diagnosis Date    Blood in stool     Colon polyps 2016    adenomatous polyp    Constipation     COPD (chronic obstructive pulmonary disease) (CMS-HCC)     Diarrhea     Diverticulosis of colon     GERD (gastroesophageal reflux disease) 2016    esophagitis    Hyperlipidemia     Hypertension     Kidney stones     Migraines      Past Surgical History:   Procedure Laterality Date    PR CATH PLACE/CORON ANGIO, IMG SUPER/INTERP,W LEFT HEART VENTRICULOGRAPHY N/A 04/02/2017    Procedure: CATH LEFT HEART CATHETERIZATION W INTERVENTION;  Surgeon: Alvira Philips, MD;  Location: [facility] CATH;  Service: Cardiology    PR COLONOSCOPY W/BIOPSY SINGLE/MULTIPLE N/A 07/05/2014    Procedure: COLONOSCOPY, FLEXIBLE, PROXIMAL TO SPLENIC FLEXURE; WITH BIOPSY, SINGLE OR MULTIPLE;  Surgeon: Dewaine Conger, MD;  Location: [facility] PROCEDURES  [facility];  Service: Gastroenterology    PR ESOPHAGEAL MOTILITY STUDY, MANOMETRY N/A 07/04/2015    Procedure: ESOPHAGEAL MOTILITY STUDY W/INT & REP;  Surgeon: Nurse-Based Giproc;  [facility];  Service: Gastroenterology    PR GERD TST W/ MUCOS IMPEDE ELECTROD,>1HR N/A 07/04/2015    Procedure: ESOPHAGEAL FUNCTION TEST, GASTROESOPHAGEAL REFLUX TEST W/ NASAL CATHETER INTRALUMINAL IMPEDANCE ELECTRODE(S) PLACEMENT, RECORDING, ANALYSIS AND INTERPRETATION; PROLONGED;  Surgeon: Nurse-Based Giproc;  Location: GI PROC[facility]URES [facility];  Service: Gastroenterology    PR UP GI ENDOSCOPY,BALL DIL,30MM N/A 07/05/2014    Procedure: UGI ENDO; W/BALLOON DILAT ESOPHAGUS (<30MM DIAM);  Surgeon: Dewaine Conger, MD;  Location: [facility] PROCEDURES [facility];  Service: Gastroenterology    PR UP GI ENDOSCOPY,BALL DIL,30MM N/A 02/01/2018    Procedure: UGI ENDO; W/BALLOON DILAT ESOPHAGUS (<30MM DIAM);  Surgeon: Leland Her, MD;  Location: [facility];  Service: Gastroenterology    PR UP GI ENDOSCOPY,DILATN W GUIDE N/A 11/16/2014    Procedure: UGI ENDOSCOPY; WITH INSERTION OF GUIDE WIRE FOLLOWED BY DILATION OF ESOPHAGUS OVER GUIDE WIRE;  Surgeon: Erskin Burnet, MD;  Location: GI PROCEDURES [facility];  Service: Gastroenterology    PR UPPER GI ENDOSCOPY,BIOPSY N/A 07/05/2014    Procedure: UGI ENDOSCOPY; WITH BIOPSY, SINGLE OR MULTIPLE;  Surgeon: Dewaine Conger, MD;  Location: [facility] PROCEDURES [facility];  Service: Gastroenterology    PR UPPER GI ENDOSCOPY,W/DIR SUBMUC INJ N/A 02/01/2018    Procedure: UGI ENDO, INCL ESOPH/STOMACH/& EITHER DUODENUM AND/OR JEJUNUM AS APPROP; WITH DIRECTED SUBMUCOSAL INJECTION;  Surgeon: Leland Her, MD;  Location: GI [facility]EDURES [facility];  Service: Gastroenterology    SAL[facility] GL[facility] SURGERY         Other History:  The social history and family history were personally reviewed and updated in the patient's electronic medical record.    Family History   Problem Relation Age of Onset    Colorectal Cancer Neg Hx     Crohn's disease Neg Hx     Ulcerative colitis Neg Hx      Social History     Socioeconomic History    Marital status: Married   Tobacco Use    Smoking status: Every Day     Current packs/day: 0.50     Average packs/day: 0.5 packs/day for 40.0 years (20.0 ttl pk-yrs)     Types: Cigarettes    Smokeless tobacco: Former     Types: Snuff     Quit date: 2021    Tobacco comments:     pt reports that he is down to 6 cigs/day- after meals   Vaping Use    Vaping status: Some Days    Substances: Nicotine   Substance and Sexual Activity    Alcohol use: No     Alcohol/week: 0.0 standard drinks of alcohol    Drug use: Not Currently     Frequency: 2.0 times per week     Comment: has decreased MJ use down to 3x/week- previously documented as 14    Sexual activity: Yes     Partners: Female   Social History Narrative    Was incarcerated 5 days just prior to admission. Denies any recent drug use other than marijuana, states that he remotely used cocaine and was previously incarcerated 6 years for drug charges.        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 (Reviewed 09/02/17)     Social Determinants of Health  Financial Resource Strain: Low Risk  (05/12/2019)    Overall Financial Resource Strain (CARDIA)     Difficulty of Paying Living Expenses: Not hard at all   Food Insecurity: Unknown (05/12/2019)    Hunger Vital Sign     Worried About Running Out of Food in the Last Year: Patient declined     Ran Out of Food in the Last Year: Patient declined   Transportation Needs: Unknown (05/12/2019)    PRAPARE - Therapist, art (Medical): Patient declined     Lack of Transportation (Non-Medical): Patient declined       Home Medications:  Current Outpatient Medications on File Prior to Visit   Medication Sig Dispense Refill    ADDERALL XR 30 mg 24 hr capsule Take 1 capsule (30 mg total) by mouth.      albuterol HFA 90 mcg/actuation inhaler Inhale 2 puffs every six (6) hours as needed for wheezing. 8.5 g 2    aspirin (ECOTRIN) 81 MG tablet Take 1 tablet (81 mg total) by mouth daily.      atorvastatin (LIPITOR) 80 MG tablet TAKE ONE (1) TABLET BY MOUTH ONCE DAILY 90 tablet 0    carvediloL (COREG) 12.5 MG tablet Take 1 tablet (12.5 mg total) by mouth Two (2) times a day. 180 tablet 3    dextroamphetamine-amphetamine (ADDERALL) 20 mg tablet       empty container (SHARPS-A-GATOR DISPOSAL SYSTEM) Misc Use as directed for sharps disposal 1 each 2    evolocumab (REPATHA SURECLICK) 140 mg/mL PnIj Inject the contents of one pen (140 mg) under the skin every fourteen (14) days. 2 mL 5    ezetimibe (ZETIA) 10 mg tablet TAKE ONE TABLET BY MOUTH DAILY. 90 tablet 0    fluticasone propionate (FLONASE) 50 mcg/actuation nasal spray       fluticasone-umeclidin-vilanter (TRELEGY ELLIPTA) 100-62.5-25 mcg inhaler Inhale 1 puff daily. 60 each 5    hydrOXYzine (ATARAX) 50 MG tablet Take 1 tablet (50 mg total) by mouth Three (3) times a day as needed for itching. Unsure of dose      ipratropium-albuteroL (DUO-NEB) 0.5-2.5 mg/3 mL nebulizer USE 1 VIAL VIA NEBULIZER EVERY 6 HOURS AS NEEDED. 360 mL 6    nitroglycerin (NITROSTAT) 0.4 MG SL tablet PLACE 1 TABLET UNDER THE TONGUE EVERY 5 MINUTES AS NEEDED FOR CHEST PAIN. MAX OF3 DOSES IN 15 MINUTES. 25 each 0    omeprazole (PRILOSEC) 20 MG capsule TAKE (1) CAPSULE BY MOUTH TWICE DAILY BEFORE MEALS 180 capsule 1    QUEtiapine (SEROQUEL) 100 MG tablet Take 1 tablet (100 mg total) by mouth nightly. Takes full 100 mg as of Feb 2024      sildenafil (VIAGRA) 100 MG tablet Take 1 tablet (100 mg total) by mouth daily as needed for erectile dysfunction. 10 tablet 11    tamsulosin (FLOMAX) 0.4 mg capsule Take 2 capsules (0.8 mg total) by mouth daily. 180 capsule 3     No current facility-administered medications on file prior to visit.       Allergies:  Allergies as of 09/24/2022 - Reviewed 09/21/2022   Allergen Reaction Noted    Lopid [gemfibrozil] Other (See Comments) 07/05/2014    Metoprolol Headache 04/01/2017    Statins-hmg-coa reductase inhibitors  10/30/2016    Lexapro [escitalopram oxalate] Other (See Comments) and Anxiety 07/04/2015       Review of Systems:  A comprehensive review of systems was completed and negative except as noted in HPI.  PHYSICAL EXAM:   There were no vitals taken for this visit.  GEN: NAD, sitting in chair looking straight ahead  EYES: EOMI, sclera anicteric  ENT: Trachea midline, MMM  CV: RRR, no murmurs appreciated  PULM: CTA BIlaterally today, with only a harsh expiratory upper airway wheeze on forced exhalation  EXT: No edema, no clubbing  NEURO: Grossly Non-focal, moving all extremities normally  PSYCH: A+Ox3, appropriate    LABORATORY and RADIOLOGY DATA:     Pulmonary Function Tests/Interpretation:    Date FEV1  (Pre/Post) FVC  (Pre/Post) FEV1/FVC  (Pre/Post) DLCO   07/31/21  1.97 (49.9%) / 2.01 (51.0%) 4.35 (85.4%) / 4.45 (87.3%) 45% / 45% 22.4 (75%)     Spirometry: Spirometry shows moderate obstructive impairment. No improvement after inhaled bronchodilator  Flow Volume:  normal forced inspiratory flows.    Lung volume:  Lung volumes are consistent with hyperinflation and Lung volumes are consistent with gas trapping. (RV 232%, TLC 132%, FRC 186%)    DLCO:  DLCO is mildly reduced      : not performed today  Informal with me in the hallway and up half a flight of stairs resulted in chest tightness with still good air movement on exam, O2 saturation nadir ws 92%.  Walked less than 34m      Pertinent Laboratory Data:  10/16/21: normal folate, iron panel, ESR. Mildly low TSH (0.385). Nl hgb, wbc, plts     Pertinent Imaging Data:  Screening Chest CCT 07/17/21: mild emphysema and bronchial wall thickening, some mucous plugging and micronodules, Lung RADS 1--repeat in 12 months    V/Q/Spect: normal perfusion, changes c/w COPD and emphysema        Echo 08/25/21: normal EF, RV not well visualized but probably normal; difficult study    ASSESSMENT and PLAN     Chase Mendoza is a 57 y.o. male with COPD, CAD s/p PCI to RCA in 2018, HTN, HLD, GERD, renal stones, persistent tobacco use (cigarettes) who is here for COPD management    There are no diagnoses linked to this encounter.    #COPD   Borderline moderate-severe by GOLD criteria; GOLD 2B. Hyperinflation and gas-trapping on PFTs as well as chest CT. Persistent symptoms of shortness of breath with exertion, chest tightness with SOB, some phlegm production. Reduced activity level 2/2 difficulty breathing.       - Continue Trelegy  - Continue Albuterol PRN  - Currently not a candidate for BLVR, but he can abstain from smoking, he would qualify for them and they could improve his sxs  - really needs to work on smoking cessation.  Have pushed him to be down to 4 cigs a day by next visit      #Shortness of Breath  COPD is likely a major contributor to patient's SOB, however SOB is out of proportion to patient's moderate-severe COPD by GOLD criteria and is not responding well to albuterol. Other possible contributing factors include cardiac and deconditioning. No anemia on recent CBC.     -sleep study in November 2023 (ordered already)  - with f/u    Have had conversations with his cardiology team and he complained mostly of fatigue to them but definitely describes chest tightness to me.   I am limited in what else I can offer as although he has lung disease, he is on maximal therapy and seems to be able to use the inhaler, neg V/Q, stopped pulm rehab, definitely has horrible emphysema but still smoking so  can't get valves, but symptoms are definitely out of proportion to his degree of obstruction, didn't have hypoxia with the mild ambuliaton we did today.  I am hoping a CPAP might help (if he qualifies) and will con't to discuss smoking cessation with him.          No follow-ups on file.      ZO:[id number] [employer], [place]

## 2022-09-24 MED FILL — REPATHA SURECLICK 140 MG/ML SUBCUTANEOUS PEN INJECTOR: SUBCUTANEOUS | 28 days supply | Qty: 2 | Fill #1

## 2022-10-21 NOTE — Unmapped (Signed)
PRE-CHARTING ONLY.  FINAL DOCUMENT PENDING CLINIC VISIT    PRE-CHARTING ONLY.  FINAL DOCUMENT PENDING CLINIC VISIT        Pulmonary Clinic - Return Visit    Referring Physician :  Baxter Kail  PCP:     [facility], [place]  Reason for Consult:   COPD    HISTORY:     History of Present Illness:  Mr. Mcgary is a 57 y.o. male with a history of COPD, CAD s/p PCI to RCA in 2018, HTN, HLD, GERD, renal stones, persistent tobacco use (cigarettes)  whom we are seeing in consultation requested by Baxter Kail for evaluation of COPD management and worsening shortness of breath.    Pertinent History/Chart Review   Mr. Grahl was 1st seen in March 2023 by our pulmonary clinic. He presented with SOB with minimal activity, productive cough (white/clear), dizzy, wakes up at night bc can't breathe. His symptoms improved some with duo neb, more with Trelegy. He is a current smoker (20 pk/yr) who is trying to quit. Medication selection and treatment options limited by finances. He was seen by Cardiology 10/16/2021 history notable for fatigue, anemia, and leukocytosis, CBC was normal in May.  All of his w/u has been negative to date (other than emphysema) wtih the primary complaint being fatigue    Interim History Last seen Oct 2023    [facility] ED in november with COPD exacerbation; given course of steroids.  Remains on Repatha with good lipid panel    Sleep study march 27 (Titration study):  needs CPAP at 12, medium mask.  Encouraged smoking cessation        He did pulm rehab on line but it wasn't working for him so he stopped it.  It was too much and made his back hurt.        He usually takes seroquel to help him sleep as without it he won't go to sleep.  He is scheduled for a sleep study in November.    He continues to smoke 6-8 cigarettes per day.  He has tried patches, gum.  Chantix made him more depressed. Wellbrutirn did the same.         Most recent MMRC dyspnea scale: Not Found Last [facility] date: Not Found    Date CAT [facility]   March 29, 2019  1    August 28, 2021  19     December 18, 2021 21               Past Medical History:  Past Medical History:   Diagnosis Date    Blood in stool     Colon polyps 2016    adenomatous polyp    Constipation     COPD (chronic obstructive pulmonary disease) (CMS-HCC)     Diarrhea     Diverticulosis of colon     GERD (gastroesophageal reflux disease) 2016    esophagitis    Hyperlipidemia     Hypertension     Kidney stones     Migraines      Past Surgical History:   Procedure Laterality Date    PR CATH PLACE/CORON ANGIO, IMG SUPER/INTERP,W LEFT HEART VENTRICULOGRAPHY N/A 04/02/2017    Procedure: CATH LEFT HEART CATHETERIZATION W INTERVENTION;  Surgeon: Alvira Philips, MD;  Location: [facility] CATH;  Service: Cardiology    PR COLONOSCOPY W/BIOPSY SINGLE/MULTIPLE N/A 07/05/2014    Procedure: COLONOSCOPY, FLEXIBLE, PROXIMAL TO SPLENIC FLEXURE; WITH BIOPSY, SINGLE OR MULTIPLE;  Surgeon: Dewaine Conger,  MD;  Location: [facility] GI PROCEDURES [place];  Service: Gastroenterology    PR ESOPHAGEAL MOTILITY STUDY, MANOMETRY N/A 07/04/2015    Procedure: ESOPHAGEAL MOTILITY STUDY W/INT & REP;  Surgeon: Nurse-Based Giproc;  [facility];  Service: Gastroenterology    PR GERD TST W/ MUCOS IMPEDE ELECTROD,>1HR N/A 07/04/2015    Procedure: ESOPHAGEAL FUNCTION TEST, GASTROESOPHAGEAL REFLUX TEST W/ NASAL CATHETER INTRALUMINAL IMPEDANCE ELECTRODE(S) PLACEMENT, RECORDING, ANALYSIS AND INTERPRETATION; PROLONGED;  Surgeon: Nurse-Based Giproc;  Location: [facility];  Service: Gastroenterology    PR UP GI ENDOSCOPY,BALL DIL,30MM N/A 07/05/2014    Procedure: UGI ENDO; W/BALLOON DILAT ESOPHAGUS (<30MM DIAM);  Surgeon: Dewaine Conger, MD;  Location: [facility];  Service: Gastroenterology    PR UP GI ENDOSCOPY,BALL DIL,30MM N/A 02/01/2018    Procedure: UGI ENDO; W/BALLOON DILAT ESOPHAGUS (<30MM DIAM);  Surgeon: Leland Her, MD;  [facility]: [facility];  Service: Gastroenterology    PR UP GI ENDOSCOPY,DILATN W GUIDE N/A 11/16/2014    Procedure: UGI ENDOSCOPY; WITH INSERTION OF GUIDE WIRE FOLLOWED BY DILATION OF ESOPHAGUS OVER GUIDE WIRE;  Surgeon: Erskin Burnet, MD;  Location: GI PROCEDURES [place] [facility];  Service: Gastroenterology    PR UPPER GI ENDOSCOPY,BIOPSY N/A 07/05/2014    Procedure: UGI ENDOSCOPY; WITH BIOPSY, SINGLE OR MULTIPLE;  Surgeon: Dewaine Conger, MD;  Location: [facility];  Service: Gastroenterology    PR UPPER GI ENDOSCOPY,W/DIR SUBMUC INJ N/A 02/01/2018    Procedure: UGI ENDO, INCL ESOPH/STOMACH/& EITHER DUODENUM AND/OR JEJUNUM AS APPROP; WITH DIRECTED SUBMUCOSAL INJECTION;  Surgeon: Leland Her, MD;  Location: [facility]URES [facility];  Service: Gastroenterology    SALIVARY GLAND SURGERY         Other History:  The social history and family history were personally reviewed and updated in the patient's electronic medical record.    Family History   Problem Relation Age of Onset    Colorectal Cancer Neg Hx     Crohn's disease Neg Hx     Ulcerative colitis Neg Hx      Social History     Socioeconomic History    Marital status: Married   Tobacco Use    Smoking status: Every Day     Current packs/day: 0.50     Average packs/day: 0.5 packs/day for 40.0 years (20.0 ttl pk-yrs)     Types: Cigarettes    Smokeless tobacco: Former     Types: Snuff     Quit date: 2021    Tobacco comments:     pt reports that he is down to 6 cigs/day- after meals   Vaping Use    Vaping status: Some Days    Substances: Nicotine   Substance and Sexual Activity    Alcohol use: No     Alcohol/week: 0.0 standard drinks of alcohol    Drug use: Not Currently     Frequency: 2.0 times per week     Comment: has decreased MJ use down to 3x/week- previously documented as 14    Sexual activity: Yes     Partners: Female   Social History Narrative    Was incarcerated 5 days just prior to admission. Denies any recent drug use other than marijuana, states that he remotely used cocaine and was previously incarcerated 6 years for drug charges.        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 (Reviewed 09/02/17)  Social Determinants of Health     Financial Resource Strain: Low Risk  (05/12/2019)    Overall Financial Resource Strain (CARDIA)     Difficulty of Paying Living Expenses: Not hard at all   Food Insecurity: Unknown (05/12/2019)    Hunger Vital Sign     Worried About Running Out of Food in the Last Year: Patient declined     Ran Out of Food in the Last Year: Patient declined   Transportation Needs: Unknown (05/12/2019)    PRAPARE - Therapist, art (Medical): Patient declined     Lack of Transportation (Non-Medical): Patient declined       Home Medications:  Current Outpatient Medications on File Prior to Visit   Medication Sig Dispense Refill    ADDERALL XR 30 mg 24 hr capsule Take 1 capsule (30 mg total) by mouth.      albuterol HFA 90 mcg/actuation inhaler Inhale 2 puffs every six (6) hours as needed for wheezing. 8.5 g 2    aspirin (ECOTRIN) 81 MG tablet Take 1 tablet (81 mg total) by mouth daily.      atorvastatin (LIPITOR) 80 MG tablet TAKE ONE (1) TABLET BY MOUTH ONCE DAILY 90 tablet 0    carvediloL (COREG) 12.5 MG tablet Take 1 tablet (12.5 mg total) by mouth Two (2) times a day. 180 tablet 3    dextroamphetamine-amphetamine (ADDERALL) 20 mg tablet       empty container (SHARPS-A-GATOR DISPOSAL SYSTEM) Misc Use as directed for sharps disposal 1 each 2    evolocumab (REPATHA SURECLICK) 140 mg/mL PnIj Inject the contents of one pen (140 mg) under the skin every fourteen (14) days. 2 mL 5    ezetimibe (ZETIA) 10 mg tablet TAKE ONE TABLET BY MOUTH DAILY. 90 tablet 0    fluticasone propionate (FLONASE) 50 mcg/actuation nasal spray       fluticasone-umeclidin-vilanter (TRELEGY ELLIPTA) 100-62.5-25 mcg inhaler Inhale 1 puff daily. 60 each 5    hydrOXYzine (ATARAX) 50 MG tablet Take 1 tablet (50 mg total) by mouth Three (3) times a day as needed for itching. Unsure of dose      ipratropium-albuteroL (DUO-NEB) 0.5-2.5 mg/3 mL nebulizer USE 1 VIAL VIA NEBULIZER EVERY 6 HOURS AS NEEDED. 360 mL 6    nitroglycerin (NITROSTAT) 0.4 MG SL tablet PLACE 1 TABLET UNDER THE TONGUE EVERY 5 MINUTES AS NEEDED FOR CHEST PAIN. MAX OF3 DOSES IN 15 MINUTES. 25 each 0    omeprazole (PRILOSEC) 20 MG capsule TAKE (1) CAPSULE BY MOUTH TWICE DAILY BEFORE MEALS 180 capsule 1    QUEtiapine (SEROQUEL) 100 MG tablet Take 1 tablet (100 mg total) by mouth nightly. Takes full 100 mg as of Feb 2024      sildenafil (VIAGRA) 100 MG tablet Take 1 tablet (100 mg total) by mouth daily as needed for erectile dysfunction. 10 tablet 11    tamsulosin (FLOMAX) 0.4 mg capsule Take 2 capsules (0.8 mg total) by mouth daily. 180 capsule 3     No current facility-administered medications on file prior to visit.       Allergies:  Allergies as of 10/22/2022 - Reviewed 09/21/2022   Allergen Reaction Noted    Lopid [gemfibrozil] Other (See Comments) 07/05/2014    Metoprolol Headache 04/01/2017    Statins-hmg-coa reductase inhibitors  10/30/2016    Lexapro [escitalopram oxalate] Other (See Comments) and Anxiety 07/04/2015       Review of Systems:  A comprehensive review of systems was completed  and negative except as noted in HPI.    PHYSICAL EXAM:   There were no vitals taken for this visit.  GEN: NAD, sitting in chair looking straight ahead  EYES: EOMI, sclera anicteric  ENT: Trachea midline, MMM  CV: RRR, no murmurs appreciated  PULM: CTA BIlaterally today, with only a harsh expiratory upper airway wheeze on forced exhalation  EXT: No edema, no clubbing  NEURO: Grossly Non-focal, moving all extremities normally  PSYCH: A+Ox3, appropriate    LABORATORY and RADIOLOGY DATA:     Pulmonary Function Tests/Interpretation:    Date FEV1  (Pre/Post) FVC  (Pre/Post) FEV1/FVC  (Pre/Post) DLCO   07/31/21  1.97 (49.9%) / 2.01 (51.0%) 4.35 (85.4%) / 4.45 (87.3%) 45% / 45% 22.4 (75%)     Spirometry:  Spirometry shows moderate obstructive impairment. No improvement after inhaled bronchodilator  Flow Volume:  normal forced inspiratory flows.    Lung volume:  Lung volumes are consistent with hyperinflation and Lung volumes are consistent with gas trapping. (RV 232%, TLC 132%, FRC 186%)    DLCO:  DLCO is mildly reduced      : not performed today  Informal with me in the hallway and up half a flight of stairs resulted in chest tightness with still good air movement on exam, O2 saturation nadir ws 92%.  Walked less than 72m      Pertinent Laboratory Data:  10/16/21: normal folate, iron panel, ESR. Mildly low TSH (0.385). Nl hgb, wbc, plts     Pertinent Imaging Data:  Screening Chest CCT 07/17/21: mild emphysema and bronchial wall thickening, some mucous plugging and micronodules, Lung RADS 1--repeat in 12 months    V/Q/Spect: normal perfusion, changes c/w COPD and emphysema        Echo 08/25/21: normal EF, RV not well visualized but probably normal; difficult study    ASSESSMENT and PLAN     AZARION NORBY is a 57 y.o. male with COPD, CAD s/p PCI to RCA in 2018, HTN, HLD, GERD, renal stones, persistent tobacco use (cigarettes) who is here for COPD management      #COPD   Borderline moderate-severe by GOLD criteria; GOLD 2B. Hyperinflation and gas-trapping on PFTs as well as chest CT. Persistent symptoms of shortness of breath with exertion, chest tightness with SOB, some phlegm production. Reduced activity level 2/2 difficulty breathing.       - Continue Trelegy  - Continue Albuterol PRN  - Currently not a candidate for BLVR, but if he can abstain from smoking, he would qualify for them and they could improve his sxs  - really needs to work on smoking cessation.  Have pushed him to be down to 4 cigs a day by next visit      #Shortness of Breath  COPD is likely a major contributor to patient's SOB, however SOB is out of proportion to patient's moderate-severe COPD by GOLD criteria and moderate-severe by GOLD criteria; GOLD 2B. Hyperinflation and gas-trapping on PFTs as well as chest CT. Persistent symptoms of shortness of breath with exertion, chest tightness with SOB, some phlegm production. Reduced activity level 2/2 difficulty breathing.   Did a trial of nebulized hypertonic saline today to see if it would help and he didn't think it did.  Short of switching to all nebs to see if improved delivery would help, I explained there aren't a lot more options to help his COPD, particularly while he con't to smoke.  He seems to think the Trelegy works for him  so will con't that.      - Continue Trelegy  - Continue Albuterol PRN  - Currently not a candidate for BLVR, but if he can abstain from smoking, he would qualify for them and they could improve his sxs  - con't to discuss smoking cessation  - encouraged return to pulm rehab    #Shortness of Breath  COPD is likely a major contributor to patient's SOB, however SOB is out of proportion to patient's moderate-severe COPD by GOLD criteria and is not responding well to albuterol. Other possible contributing factors include cardiac and deconditioning. No anemia on recent CBC. Cardiac w/u has been unremarkable.  Suspect deconditioining may be a bigger factor than any of [place] can attribute to    #OSA  I think obtaining some positive pressure would help both his OSA and his COPD so will see if able to get the CPAP sorted as it has been ordered by his PCP  --got a sleep study and CPAP ordered by someone  --trying to find the ordering doc and if unable, will order his CPAP again      Return in about 6 months (around 04/24/2023).

## 2022-10-22 ENCOUNTER — Ambulatory Visit: Admit: 2022-10-22 | Discharge: 2022-10-23 | Payer: PRIVATE HEALTH INSURANCE

## 2022-10-22 DIAGNOSIS — J411 Mucopurulent chronic bronchitis: Principal | ICD-10-CM

## 2022-10-22 DIAGNOSIS — J449 Chronic obstructive pulmonary disease, unspecified: Principal | ICD-10-CM

## 2022-10-22 MED ADMIN — sodium chloride 7% NEBULIZER solution 3 mL: 3 mL | RESPIRATORY_TRACT | @ 16:00:00 | Stop: 2022-10-22

## 2022-10-22 MED ADMIN — albuterol 2.5 mg /3 mL (0.083 %) nebulizer solution 2.5 mg: 2.5 mg | RESPIRATORY_TRACT | @ 16:00:00 | Stop: 2022-10-22

## 2022-10-22 NOTE — Unmapped (Signed)
Sputum induction performed with Albuterol and Sodium Chloride 7%. Treatment was Well tolerated  , and was not able to produce a specimen for testing.

## 2022-10-22 NOTE — Unmapped (Signed)
[facility] Specialty Pharmacy Refill Coordination Note    Specialty Lite Medication(s) to be Shipped:   General Specialty: Repatha    Other medication(s) to be shipped: No additional medications requested for fill at this time     Chase Mendoza, DOB: [birth year]  Phone: [phone] (home) [phone] (work)      All above HIPAA information was verified with patient.     Was a Nurse, learning disability used for this call? No    Changes to medications: Chase Mendoza reports no changes at this time.  Changes to insurance: No      REFERRAL TO PHARMACIST     Referral to the pharmacist: Not needed      [facility]     Shipping address confirmed in Epic.     Delivery Scheduled: Yes, Expected medication delivery date: 10/28/22.     Medication will be delivered via UPS to the prescription address in Epic WAM.    Chase Mendoza' [address] [facility] Specialty Technician

## 2022-10-22 NOTE — Unmapped (Signed)
Trial of hypertonic saline today to see if we can clear your airways.  Con't the Trelegy  If you can stop smoking, you would be a candidate for the one-way lung valves  Will see what happened to your CPAP  If you change your mind about pulmonary rehab, I would love for you to go back.      If your upcoming follow-up appointment is set for July 2024 or later, please be aware that pending hospital rounding responsibilities may impact scheduling during that period.  Should rescheduling be necessary, our team will promptly reach out to you with updated appointment information.  We appreciate your understanding and look forward to your next visit.    Thank you for visiting the [facility] Pulmonary [facility]. You may receive a survey regarding your visit - we are very interested in your responses so we can continue to improve your clinic experience.     For medical emergencies: please call 911     For urgent medical issues after hours: [phone], [facility] Operator (ask for the pulmonary fellow on call)     For the COPD nurse, Selinda Eon, call [phone]    For pulmonary clinic appointments/Nurse line: please call [phone] or send a message through [facility]    For ANCA Vasculitis/Nephrology clinic questions and/or appointments: [phone]

## 2022-10-27 MED FILL — REPATHA SURECLICK 140 MG/ML SUBCUTANEOUS PEN INJECTOR: SUBCUTANEOUS | 28 days supply | Qty: 2 | Fill #2

## 2022-12-01 NOTE — Unmapped (Signed)
[facility] Specialty Pharmacy Refill Coordination Note    Specialty Medication(s) to be Shipped:   General Specialty: Repatha    Other medication(s) to be shipped: No additional medications requested for fill at this time     Chase Mendoza, DOB: 1966-05-09  Phone: [phone] (home)       All above HIPAA information was verified with patient.     Was a Nurse, learning disability used for this call? No    Completed refill call assessment today to schedule patient's medication shipment from the [facility] [phone]).  All relevant notes have been reviewed.     Specialty medication(s) and dose(s) confirmed: Regimen is correct and unchanged.   Changes to medications: Gari reports no changes at this time.  Changes to insurance: No  New side effects reported not previously addressed with a pharmacist or physician: None reported  Questions for the pharmacist: No    Confirmed patient received a Conservation officer, historic buildings and a Surveyor, mining with first shipment. The patient will receive a drug information handout for each medication shipped and additional FDA Medication Guides as required.       DISEASE/MEDICATION-SPECIFIC INFORMATION        For patients on injectable medications: Patient currently has 1 doses left.  Next injection is scheduled for 7/1.    SPECIALTY MEDICATION ADHERENCE     Medication Adherence    Patient reported X missed doses in the last month: 0  Specialty Medication: REPATHA SURECLICK 140 mg/mL Pnij (evolocumab)              Were doses missed due to medication being on hold? No    repatha sureclick 140mg /ml    : 6 days of medicine on hand       REFERRAL TO PHARMACIST     Referral to the pharmacist: Not needed      [facility]     Shipping address confirmed in Epic.       Delivery Scheduled: Yes, Expected medication delivery date: 7/2.     Medication will be delivered via UPS to the prescription address in Epic WAM.    [name]   [facility] Specialty Technician

## 2022-12-07 DIAGNOSIS — I2510 Atherosclerotic heart disease of native coronary artery without angina pectoris: Principal | ICD-10-CM

## 2022-12-07 DIAGNOSIS — E782 Mixed hyperlipidemia: Principal | ICD-10-CM

## 2022-12-08 MED FILL — REPATHA SURECLICK 140 MG/ML SUBCUTANEOUS PEN INJECTOR: SUBCUTANEOUS | 28 days supply | Qty: 2 | Fill #3

## 2022-12-08 NOTE — Unmapped (Signed)
TOBECHUKWU FRONHEISER 's Repatha shipment will be sent out  as a result of prior authorization now approved.     I have reached out to the patient  at [phone] and communicated the delivery change. We will reschedule the medication for the delivery date that the patient agreed upon.  We have confirmed the delivery date as 7/3, via ups.

## 2023-01-13 NOTE — Unmapped (Signed)
[facility] Specialty Pharmacy Refill Coordination Note    Specialty Lite Medication(s) to be Shipped:   Repatha    Other medication(s) to be shipped: sharps kit     Chase Mendoza, DOB: [birth year]  Phone: [phone] (home)       All above HIPAA information was verified with patient.     Was a Nurse, learning disability used for this call? No    Changes to medications: [name] reports no changes at this time.  Changes to insurance: No      REFERRAL TO PHARMACIST     Referral to the pharmacist: Not needed      [facility]     Shipping address confirmed in Epic.     Delivery Scheduled: Yes, Expected medication delivery date: 8/13.     Medication will be delivered via UPS to the prescription address in Epic WAM.    [name]   [facility] Specialty Technician

## 2023-01-18 MED FILL — EMPTY CONTAINER: 120 days supply | Qty: 1 | Fill #1

## 2023-01-18 MED FILL — REPATHA SURECLICK 140 MG/ML SUBCUTANEOUS PEN INJECTOR: SUBCUTANEOUS | 28 days supply | Qty: 2 | Fill #4

## 2023-02-17 ENCOUNTER — Ambulatory Visit: Admit: 2023-02-17 | Discharge: 2023-02-17 | Payer: PRIVATE HEALTH INSURANCE

## 2023-02-17 DIAGNOSIS — M2400 Loose body in unspecified joint: Principal | ICD-10-CM

## 2023-03-09 NOTE — Unmapped (Signed)
[facility] Specialty and Home Delivery Pharmacy Refill Coordination Note    Chase Mendoza, DOB: June 17, 1965  Phone: [phone] (home)       All above HIPAA information was verified with patient.         03/08/2023     4:21 PM   Specialty Rx Medication Refill Questionnaire   Which Medications would you like refilled and shipped? Repatha   Please list all current allergies: Lopid   Have you missed any doses in the last 30 days? No   Have you had any changes to your medication(s) since your last refill? No   How many days remaining of each medication do you have at home? 0   If receiving an injectable medication, next injection date is 03/14/2023   Have you experienced any side effects in the last 30 days? No   Please enter the full address (street address, city, state, zip code) where you would like your medication(s) to be delivered to. [address] [name] [address]   Please specify on which day you would like your medication(s) to arrive. Note: if you need your medication(s) within 3 days, please call the pharmacy to schedule your order at [phone]  03/11/2023   Has your insurance changed since your last refill? No   Would you like a pharmacist to call you to discuss your medication(s)? No   Do you require a signature for your package? (Note: if we are billing Medicare Part B or your order contains a controlled substance, we will require a signature) No   Additional Comments: None         Completed refill call assessment today to schedule patient's medication shipment from the [facility] [phone]).  All relevant notes have been reviewed.       Confirmed patient received a Conservation officer, historic buildings and a Surveyor, mining with first shipment. The patient will receive a drug information handout for each medication shipped and additional FDA Medication Guides as required.         REFERRAL TO PHARMACIST     Referral to the pharmacist: Not needed      [facility]     Shipping address confirmed in Epic.     Delivery Scheduled: Yes, Expected medication delivery date: 10/3.     Medication will be delivered via UPS to the prescription address in Epic WAM.    Chase Mendoza   [facility] Specialty and Home Delivery Pharmacy Specialty Technician

## 2023-03-11 ENCOUNTER — Ambulatory Visit
Admit: 2023-03-11 | Discharge: 2023-03-12 | Payer: PRIVATE HEALTH INSURANCE | Attending: Adult Health | Primary: Adult Health

## 2023-03-11 DIAGNOSIS — E7801 Familial hypercholesterolemia: Principal | ICD-10-CM

## 2023-03-11 DIAGNOSIS — Z720 Tobacco use: Principal | ICD-10-CM

## 2023-03-11 DIAGNOSIS — E782 Mixed hyperlipidemia: Principal | ICD-10-CM

## 2023-03-11 DIAGNOSIS — I10 Essential (primary) hypertension: Principal | ICD-10-CM

## 2023-03-11 DIAGNOSIS — I25118 Atherosclerotic heart disease of native coronary artery with other forms of angina pectoris: Principal | ICD-10-CM

## 2023-03-11 MED ORDER — EZETIMIBE 10 MG TABLET
ORAL_TABLET | Freq: Every day | ORAL | 3 refills | 90 days | Status: CP
Start: 2023-03-11 — End: ?

## 2023-03-11 MED ORDER — REPATHA SURECLICK 140 MG/ML SUBCUTANEOUS PEN INJECTOR
SUBCUTANEOUS | 11 refills | 28 days | Status: CP
Start: 2023-03-11 — End: ?
  Filled 2023-03-10: qty 2, 28d supply, fill #5

## 2023-03-11 MED ORDER — ATORVASTATIN 80 MG TABLET
ORAL_TABLET | ORAL | 3 refills | 90 days | Status: CP
Start: 2023-03-11 — End: ?

## 2023-03-11 MED ORDER — NITROGLYCERIN 0.4 MG SUBLINGUAL TABLET
SUBLINGUAL | 0 refills | 1 days | Status: CP | PRN
Start: 2023-03-11 — End: ?

## 2023-03-11 MED ORDER — CARVEDILOL 12.5 MG TABLET
ORAL_TABLET | Freq: Two times a day (BID) | ORAL | 3 refills | 90 days | Status: CP
Start: 2023-03-11 — End: ?

## 2023-03-11 NOTE — Unmapped (Signed)
DIVISION OF CARDIOLOGY   [facility], [place]                                                                         Date of Service:  03/11/2023     Assessment/Plan:   1. CAD  s/p PCI to RCA (03/2017) with 50% mLCX lesion which has been medically managed. Had progressive anginal sx and underwent PCI of ISR of mRCA stent at [facility] 04/14/21 (due to insurance). LCx dz similar to prior.  Did not tolerate imdur (HA). Stable, infrequent angina. Echo w/ normal LV & RV function.   - continue ASA monotherapy (rash with plavix).   - carvedilol 12.5 mg BID  - lipid mgmt as per below    2. Essential hypertension  Well controlled     3. Tobacco abuse disorder  1/2 ppd. I counseled him on the importance of quitting smoking but he remains precontemplative.      4. Mixed hyperlipidemia  Continue atorvastatin 80, Zetia 10 mg, repatha.   Has had prior LDL>200, c/f familial HLD  LDL now at goal <70    Lab Results   Component Value Date    LDL 51.0 04/22/2022     5. COPD  moderate to severe COPD, cont'd smoking. Followed by pulmonology (Dr. Orson Aloe)    Return to clinic: Return in about 6 months (around 09/09/2023).      Subjective:   [id number] [facility], [place]  Patient ID: Chase Mendoza is an 57 y.o. male patient with CAD s/p PCI of RCA (04/2021), HTN, familial HLD, tobacco use, COPD, GERD who presents for follow-up.    History present illness:  Chase Mendoza is s/p PCI to RCA (03/2017) with 50% mLCX lesion which has been medically managed. Had progressive anginal sx and underwent PCI of ISR of mRCA stent at [facility] 04/14/21 (due to insurance). LCx dz similar to prior. He has not tolerated imdur.     Last seen in February 2022 by myself. Had excellent reduction of LDL w/ Repatha. Now on ASA monotherapy (rash w/ plavix).       Here today for f/up. Overall doing ok. He was approved for disability.   Chest pain has been very infrequent - took NTG on Tuesday with relief. Mostly occurring with stress and not exertion. No swelling, palpitations, LH/dizziness. Can't walk far before getting SOB. Showering and putting on shoes makes him winded.   Now has cpap and using nightly, feels like it has helped his sleep.   Not ready to quit smoking, currently 1/2 ppd.     Continues to work at [employer] 10-15 hrs/wk.   Mother and brother both died of MI.     Objective:   Physical Exam:  BP 113/67  - Pulse 78  - Resp 20  - Ht 185.4 cm (6' 1)  - Wt 92.8 kg (204 lb 9.6 oz)  - SpO2 98%  - BMI 26.99 kg/m??   General-  Normal appearing male in no apparent distress.  Neurologic- Alert and oriented X3.  Cranial nerve II-XII grossly intact.  HEENT-  Normocephalic atraumatic head.  No scleral icterus.  MMM  Neck- Supple, no carotid bruis, no JVD  Lungs- Clear to auscultation,  no wheezes, rhonchi, or rhales. +cougiing  Heart- RRR, nl S1S2, no MRG  Extremities-  No clubbing or cyanosis.  No LE edema  Pulses- +2 pulses in radial bilaterally.  Psych- Normal mood, appropriate.     Notable results:  Labs:   Lab Results   Component Value Date    WBC 15.8 (H) 04/25/2022    HGB 13.5 04/25/2022    HCT 40.4 04/25/2022    PLT 273 04/25/2022     Lab Results   Component Value Date    NA 138 04/25/2022    K 3.7 04/25/2022    CREATININE 0.86 04/25/2022      Lab Results   Component Value Date    CHOL 146 06/26/2021    LDL 51.0 04/22/2022    HDL 27 (L) 06/26/2021    TRIG 177 (H) 06/26/2021     Lab Results   Component Value Date    A1C 5.7 (H) 05/13/2019      Imaging/Other:  Electrocardiogram:    From 10/16/21 showed NSR.     03/11/23 - NSR     Echo:  From 05/29/17 showed normal left ventricular systolic function, ejection fraction > 55%. Normal right ventricular systolic function. No significant valvular abnormalities.    From 08/25/21 showed LVEF 55%. The right ventricle is not well visualized but probably normal in size, with normal systolic function.     Nuclear Stress Test:  From 10/2017 was a normal myocardial perfusion study. No evidence for significant ischemia or scar noted. Post-stress EF > 65%.     From 05/26/19 showed normal myocardial perfusion study. No evidence for significant ischemia or scar is noted. Post stress: Global systolic function is normal. The ejection fraction calculated at 60%. Attenuation CT scan shows post PCI findings. CT finding show emphysematous and non-specific ground glass changes of the lungs.     Cardiac Catheterization:  From 03/2017 showed normal LV systolic function. Coronary artery disease including very long 50% mid-RCA stenosis (FFR = 0.80) and 50% mid-circumflex stenosis. Successful PCI of mid-RCA with placement of an Onyx drug eluting stent.     From [facility] 04/14/21 showed 2 vessel coronary artery disease.   Successful PCI of mid RCA. LCx disease is similar to 2018   ISR of mid RCA was target for intervention. 2.25 x 12-mm PTCA balloon, HD IVUS used for optimization. Prior stents were expanded and apposed. Diffuse ISR. 3.0 x 28-mm Synergy DES at 16 atm. 3.0 x 20-mm [place] Emerge used to post-dilate.        Glade Stanford, AGNP-C  Cardiology Nurse Practitioner  [facility]

## 2023-03-11 NOTE — Unmapped (Signed)
Pt took one Ntg sl Saturday night with relief.  Denies any cardiac symptoms.

## 2023-04-01 DIAGNOSIS — E782 Mixed hyperlipidemia: Principal | ICD-10-CM

## 2023-04-01 DIAGNOSIS — I2510 Atherosclerotic heart disease of native coronary artery without angina pectoris: Principal | ICD-10-CM

## 2023-04-01 MED ORDER — REPATHA SURECLICK 140 MG/ML SUBCUTANEOUS PEN INJECTOR
SUBCUTANEOUS | 5 refills | 28 days
Start: 2023-04-01 — End: ?

## 2023-04-16 DIAGNOSIS — E782 Mixed hyperlipidemia: Principal | ICD-10-CM

## 2023-04-16 DIAGNOSIS — I2510 Atherosclerotic heart disease of native coronary artery without angina pectoris: Principal | ICD-10-CM

## 2023-04-16 MED ORDER — REPATHA SURECLICK 140 MG/ML SUBCUTANEOUS PEN INJECTOR
SUBCUTANEOUS | 5 refills | 28 days
Start: 2023-04-16 — End: ?

## 2023-04-17 NOTE — Unmapped (Addendum)
[facility] Refill Coordination Note    Specialty Lite Medication(s) to be Shipped:   Repatha    Other medication(s) to be shipped: No additional medications requested for fill at this time     Chase Mendoza, DOB: 1965/06/16  Phone: [phone] (home)       All above HIPAA information was verified with patient.     Was a Nurse, learning disability used for this call? No    Changes to medications: Chase Mendoza reports no changes at this time.  Changes to insurance: No      REFERRAL TO PHARMACIST     Referral to the pharmacist: Not needed      [facility]     Shipping address confirmed in Epic.     Delivery Scheduled: Yes, Expected medication delivery date: 04/22/23.  However, Rx request for refills was sent to the provider as there are none remaining.     Medication will be delivered via UPS to the prescription address in Epic WAM.    Chase Mendoza, PharmD   [facility] Specialty Pharmacist

## 2023-04-18 MED ORDER — REPATHA SURECLICK 140 MG/ML SUBCUTANEOUS PEN INJECTOR
SUBCUTANEOUS | 5 refills | 28 days | Status: CP
Start: 2023-04-18 — End: ?
  Filled 2023-06-24: qty 2, 28d supply, fill #0

## 2023-04-22 DIAGNOSIS — E782 Mixed hyperlipidemia: Principal | ICD-10-CM

## 2023-04-22 DIAGNOSIS — I2510 Atherosclerotic heart disease of native coronary artery without angina pectoris: Principal | ICD-10-CM

## 2023-04-23 NOTE — Unmapped (Signed)
[name] 's Repatha shipment will be canceled as a result of pts insurance termed. New plan does not become effective until 06/09/23.      I have spoken with the patient  at [phone]  and communicated the delay. We will not reschedule the medication and have removed this/these medication(s) from the work request.  We have canceled this work request.     Informed pt to reach out to his Provider regarding situation.

## 2023-04-28 NOTE — Unmapped (Signed)
Pulmonary Clinic - Return Visit    Referring Physician :  Baxter Kail  PCP:     [facility] [place].  Ivery Quale  Reason for Consult:   COPD    HISTORY:     History of Present Illness:  Chase Mendoza is a 57 y.o. male with a history of COPD (GOLD IIB), CAD s/p PCI to RCA in 2018, HTN, HLD, GERD, renal stones, persistent tobacco use (cigarettes)  whom we are seeing in consultation requested by Baxter Kail for evaluation of COPD management and worsening shortness of breath.    Pertinent History/Chart Review   Chase Mendoza was 1st seen in March 2023 by our pulmonary clinic. He presented with SOB with minimal activity, productive cough (white/clear), dizzy, wakes up at night bc can't breathe. His symptoms improved some with duo neb, more with Trelegy. He is a current smoker (20 pk/yr) who is trying to quit. Medication selection and treatment options limited by finances. Also followed by Cardiology with primary complaint being fatigue.    Exacerbations:  Nov 2023, approx sept 2024    Interim History Last seen May 2024    Still taking Trelegy--helps.  No problems taking it in.  Has a nebulizer at home with duoneb which he takes 2-3 times a day.  Had an exacerbation about 2 months ago and got steroids/abx which helped.  Really feels like steroids help more than anything.  He can take care of his ADLs, doesn't like doing the cooking bc it makees him tired.  Can take a flight of stairs but does get SOB when he does.    He has regular sinus issues that is year round.  He was referred to ENT but it was for his ears so he cancelled it.  Might be taking atrovent nasal spray (not on his list)    Has recently been approved for medicare that will start in January    No longer using his CPAP because he slept less with it, desepite following instructions.  Then he was told his insurance wouldn't pay for it unless he did another study. Couldn't do the full face mask and the nasal one blew his mouth open.  Never tried a chin strap.  Still smoking about 1/2 ppd    Got chest pain with patches, can't tolerate Chantix or wellbutrin as it made is depression worse.    Didn't like pulm rehab--hurt his back.  Felt like it didn't help.      He usually takes seroquel to help him sleep as without it he won't go to sleep.        COPD Assessment Test  Cough: 4 (04/29/23 1101)  Phlegm: 3 (04/29/23 1101)  Chest Tightness: 3 (04/29/23 1101)  Dyspnea: 4 (04/29/23 1101)  Activity Level: 4 (04/29/23 1101)  Confidence: 1 (04/29/23 1101)  Sleep: 4 (04/29/23 1101)  Energy: 5 (04/29/23 1101)  CAT Total: 28 (04/29/23 1101)  Most recent MMRC dyspnea scale: Not Found Last [facility] date: Not Found    Date CAT [facility]   March 29, 2019  1    August 28, 2021  19     December 18, 2021 21    Oct 21, 2022 28    April 29, 2023 28          Past Medical History:  Past Medical History:   Diagnosis Date    Blood in stool     Colon polyps 2016    adenomatous polyp    Constipation  COPD (chronic obstructive pulmonary disease) (CMS-HCC)     Diarrhea     Diverticulosis of colon     GERD (gastroesophageal reflux disease) 2016    esophagitis    Hyperlipidemia     Hypertension     Kidney stones     Migraines      Sleep study march 2023: (Titration study):  needs CPAP at 12, medium mask.       Past Surgical History:   Procedure Laterality Date    PR CATH PLACE/CORON ANGIO, IMG SUPER/INTERP,W LEFT HEART VENTRICULOGRAPHY N/A 04/02/2017    Procedure: CATH LEFT HEART CATHETERIZATION W INTERVENTION;  Surgeon: Alvira Philips, MD;  Location: [facility] CATH;  Service: Cardiology    PR COLONOSCOPY W/BIOPSY SINGLE/MULTIPLE N/A 07/05/2014    Procedure: COLONOSCOPY, FLEXIBLE, PROXIMAL TO SPLENIC FLEXURE; WITH BIOPSY, SINGLE OR MULTIPLE;  Surgeon: Dewaine Conger, MD;  Location: [facility] GI PROCEDURES [facility];  Service: Gastroenterology    PR ESOPHAGEAL MOTILITY STUDY, MANOMETRY N/A 07/04/2015    Procedure: ESOPHAGEAL MOTILITY STUDY W/INT & REP;  Surgeon: Nurse-Based Giproc; Location: GI PROC[facility]URES [facility];  Service: Gastroenterology    PR GERD TST W/ MUCOS IMPEDE ELECTROD,>1HR N/A 07/04/2015    Procedure: ESOPHAGEAL FUNCTION TEST, GASTROESOPHAGEAL REFLUX TEST W/ NASAL CATHETER INTRALUMINAL IMPEDANCE ELECTRODE(S) PLACEMENT, RECORDING, ANALYSIS AND INTERPRETATION; PROLONGED;  Surgeon: Nurse-Based Giproc;  Location: GI PROCEDURES [facility];  Service: Gastroenterology    PR UP GI ENDOSCOPY,BALL DIL,30MM N/A 07/05/2014    Procedure: UGI ENDO; W/BALLOON DILAT ESOPHAGUS (<30MM DIAM);  Surgeon: Dewaine Conger, MD;  Location: [facility] GI PROCEDURES [facility];  Service: Gastroenterology    PR UP GI ENDOSCOPY,BALL DIL,30MM N/A 02/01/2018    Procedure: UGI ENDO; W/BALLOON DILAT ESOPHAGUS (<30MM DIAM);  Surgeon: Leland Her, MD;  Location: [facility] PROC[facility]URES [facility];  Service: Gastroenterology    PR UP GI ENDOSCOPY,DILATN W GUIDE N/A 11/16/2014    Procedure: UGI ENDOSCOPY; WITH INSERTION OF GUIDE WIRE FOLLOWED BY DILATION OF ESOPHAGUS OVER GUIDE WIRE;  Surgeon: Erskin Burnet, MD;  Location: GI PROCEDURES [facility];  Service: Gastroenterology    PR UPPER GI ENDOSCOPY,BIOPSY N/A 07/05/2014    Procedure: UGI ENDOSCOPY; WITH BIOPSY, SINGLE OR MULTIPLE;  Surgeon: Dewaine Conger, MD;  Location: [facility] PROC[facility];  Service: Gastroenterology    PR UPPER GI ENDOSCOPY,W/DIR SUBMUC INJ N/A 02/01/2018    Procedure: UGI ENDO, INCL ESOPH/STOMACH/& EITHER DUODENUM AND/OR JEJUNUM AS APPROP; WITH DIRECTED SUBMUCOSAL INJECTION;  Surgeon: Leland Her, MD;  Location: GI PROCEDURES [facility];  Service: Gastroenterology    SALIVARY GLAND SURGERY         Other History:  The social history and family history were personally reviewed and updated in the patient's electronic medical record.    Family History   Problem Relation Age of Onset    Colorectal Cancer Neg Hx     Crohn's disease Neg Hx     Ulcerative colitis Neg Hx      Social History     Socioeconomic History    Marital status: Married   Tobacco Use    Smoking status: Every Day     Current packs/day: 0.50     Average packs/day: 0.5 packs/day for 40.0 years (20.0 ttl pk-yrs)     Types: Cigarettes    Smokeless tobacco: Former     Types: Snuff     Quit date: 2021    Tobacco comments:     pt reports that he is down to 6 cigs/day-  after meals   Vaping Use    Vaping status: Some Days    Substances: Nicotine   Substance and Sexual Activity    Alcohol use: No     Alcohol/week: 0.0 standard drinks of alcohol    Drug use: Not Currently     Frequency: 2.0 times per week     Comment: has decreased MJ use down to 3x/week- previously documented as 14    Sexual activity: Yes     Partners: Female   Social History Narrative    Was incarcerated 5 days just prior to admission. Denies any recent drug use other than marijuana, states that he remotely used cocaine and was previously incarcerated 6 years for drug charges.        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 (Reviewed 09/02/17)     Social Drivers of Health     Financial Resource Strain: Low Risk  (05/12/2019)    Overall Financial Resource Strain (CARDIA)     Difficulty of Paying Living Expenses: Not hard at all   Food Insecurity: Unknown (05/12/2019)    Hunger Vital Sign     Worried About Running Out of Food in the Last Year: Patient declined     Ran Out of Food in the Last Year: Patient declined   Transportation Needs: Unknown (05/12/2019)    PRAPARE - Therapist, art (Medical): Patient declined     Lack of Transportation (Non-Medical): Patient declined       Home Medications:  Current Outpatient Medications on File Prior to Visit   Medication Sig Dispense Refill    ADDERALL XR 30 mg 24 hr capsule Take 1 capsule (30 mg total) by mouth.      aspirin (ECOTRIN) 81 MG tablet Take 1 tablet (81 mg total) by mouth daily.      atorvastatin (LIPITOR) 80 MG tablet Take 1 tablet (80 mg total) by mouth daily. 90 tablet 3    carvedilol (COREG) 12.5 MG tablet Take 1 tablet (12.5 mg total) by mouth two (2) times a day. 180 tablet 3    dextroamphetamine-amphetamine (ADDERALL) 20 mg tablet       empty container (SHARPS-A-GATOR DISPOSAL SYSTEM) Misc Use as directed for sharps disposal 1 each 2    evolocumab (REPATHA SURECLICK) 140 mg/mL PnIj Inject the contents of one pen (140 mg) under the skin every fourteen (14) days. 2 mL 11    evolocumab (REPATHA SURECLICK) 140 mg/mL PnIj Inject the contents of one pen (140 mg) under the skin every fourteen (14) days. 2 mL 5    ezetimibe (ZETIA) 10 mg tablet Take 1 tablet (10 mg total) by mouth daily. 90 tablet 3    fluticasone propionate (FLONASE) 50 mcg/actuation nasal spray       fluticasone-umeclidin-vilanter (TRELEGY ELLIPTA) 100-62.5-25 mcg inhaler Inhale 1 puff daily. 60 each 5    hydrOXYzine (ATARAX) 50 MG tablet Take 1 tablet (50 mg total) by mouth Three (3) times a day as needed for itching. Unsure of dose      ipratropium-albuteroL (DUO-NEB) 0.5-2.5 mg/3 mL nebulizer USE 1 VIAL VIA NEBULIZER EVERY 6 HOURS AS NEEDED. 360 mL 6    lisinopril (PRINIVIL,ZESTRIL) 10 MG tablet Take 1 tablet (10 mg total) by mouth daily.      nitroglycerin (NITROSTAT) 0.4 MG SL tablet Place 1 tablet (0.4 mg total) under the tongue every five (5) minutes as needed for chest pain. Maximum of 3 doses  in 15 minutes. 25 each 0    omeprazole (PRILOSEC) 20 MG capsule TAKE (1) CAPSULE BY MOUTH TWICE DAILY BEFORE MEALS 180 capsule 1    pregabalin (LYRICA) 50 MG capsule       QUEtiapine (SEROQUEL) 100 MG tablet Take 1 tablet (100 mg total) by mouth nightly. Takes full 100 mg as of Feb 2024      sildenafil (VIAGRA) 100 MG tablet Take 1 tablet (100 mg total) by mouth daily as needed for erectile dysfunction. 10 tablet 11    tamsulosin (FLOMAX) 0.4 mg capsule Take 2 capsules (0.8 mg total) by mouth daily. 180 capsule 3    albuterol HFA 90 mcg/actuation inhaler Inhale 2 puffs every six (6) hours as needed for wheezing. 8.5 g 2     No current facility-administered medications on file prior to visit.       Allergies:  Allergies as of 04/29/2023 - Reviewed 04/29/2023   Allergen Reaction Noted    Lopid [gemfibrozil] Other (See Comments) 07/05/2014    Metoprolol Headache 04/01/2017    Statins-hmg-coa reductase inhibitors  10/30/2016    Lexapro [escitalopram oxalate] Other (See Comments) and Anxiety 07/04/2015       Review of Systems:  A comprehensive review of systems was completed and negative except as noted in HPI.    PHYSICAL EXAM:   BP 109/82 (BP Site: L Arm, BP Position: Sitting, BP Cuff Size: Large)  - Pulse 83  - Temp 36.2 ??C (97.1 ??F) (Temporal)  - Wt 94.2 kg (207 lb 9.6 oz)  - SpO2 97%  - BMI 27.39 kg/m??   GEN: NAD, sitting in chair looking straight ahead  EYES: EOMI, sclera anicteric  ENT: Trachea midline, MMM.  Nares with some erythema and ?posterior polyp on the L side.  Couldn't tell for sure  CV: RRR, no murmurs appreciated  PULM: course bilateral rhonichi with some improvement with coughing.   No obvious crackles.a harsh expiratory upper airway wheeze on forced exhalation  EXT: No edema, no clubbing  NEURO: Grossly Non-focal, moving all extremities normally  PSYCH: A+Ox3, appropriate    LABORATORY and RADIOLOGY DATA:     Pulmonary Function Tests/Interpretation:    Date FEV1  (Pre/Post) FVC  (Pre/Post) FEV1/FVC  (Pre/Post) DLCO   07/31/21  1.97 (49.9%) / 2.01 (51.0%) 4.35 (85.4%) / 4.45 (87.3%) 45% / 45% 22.4 (75%)     Spirometry:  Spirometry shows moderate obstructive impairment. No improvement after inhaled bronchodilator  Flow Volume:  normal forced inspiratory flows.    Lung volume:  Lung volumes are consistent with hyperinflation and Lung volumes are consistent with gas trapping. (RV 232%, TLC 132%, FRC 186%)    DLCO:  DLCO is mildly reduced      : 05/12/22  Walked 4110m, O2 saturation nadir was 97% on RA      Pertinent Laboratory Data:  10/16/21: normal folate, iron panel, ESR. Mildly low TSH (0.385). Nl hgb, wbc, plts     Pertinent Imaging Data:  Screening Chest CCT 07/17/21: mild emphysema and bronchial wall thickening, some mucous plugging and micronodules, Lung RADS 1--repeat in 12 months    Chest CT 10/22/22: Scattered micronodules, unchanged. No suspicious pulmonary nodules. .  Repeat in 12 months    V/Q/Spect: normal perfusion, changes c/w COPD and emphysema        Echo 08/25/21: normal EF, RV not well visualized but probably normal; difficult study    ASSESSMENT and PLAN     Chase Mendoza is a 57 y.o. male  with COPD, CAD s/p PCI to RCA in 2018, HTN, HLD, GERD, renal stones, persistent tobacco use (cigarettes) who is here for COPD management      #COPD/ #Shortness of breath  Borderline moderate-severe by GOLD criteria; GOLD 2B. Hyperinflation and gas-trapping on PFTs as well as chest CT. Persistent symptoms of shortness of breath with exertion, chest tightness with SOB, some phlegm production. Reduced activity level 2/2 difficulty breathing.     HIs dyspnea has always been out of porportion for the disease we see, but he definitely has some air trapping.  I still think pulmonary rehab would be so helpful for him but he feels it doesn't help so won't go.    I do think treating his sinuses would help and we don't have samples today but I recommended he go to the pharmacy and get a sinu-aid rinse bottle.      He has frequent exacerbations on triple therapy, and his eosinophils have been elevated in the past, so I think it's worth trying Dupixent.  He just got his disability approved and said he will get medicare in January, so we will likely need to wait until then to initiate the process so I have asked him to send the new information when he gets it.  Until then, encouraged activity and see if keeping his sinuses clear will help.    Discussed smoking cessation again today in clinic and to consider the smoking cessation clinic    --repeat labs today and check eosinophils  --if eos still >0.3, will request dupixent   --patient to send updated information on insurance  --next screening chest CT in May  --PFTs with f/u      #OSA  --not using his CPAP        Return in about 6 months (around 10/27/2023).

## 2023-04-29 ENCOUNTER — Ambulatory Visit: Admit: 2023-04-29 | Discharge: 2023-04-29 | Payer: MEDICARE

## 2023-04-29 DIAGNOSIS — F17210 Nicotine dependence, cigarettes, uncomplicated: Principal | ICD-10-CM

## 2023-04-29 DIAGNOSIS — T7840XD Allergy, unspecified, subsequent encounter: Principal | ICD-10-CM

## 2023-04-29 DIAGNOSIS — J410 Simple chronic bronchitis: Principal | ICD-10-CM

## 2023-04-29 LAB — CBC W/ AUTO DIFF
BASOPHILS ABSOLUTE COUNT: 0.1 10*9/L (ref 0.0–0.1)
BASOPHILS RELATIVE PERCENT: 0.8 %
EOSINOPHILS ABSOLUTE COUNT: 0.5 10*9/L (ref 0.0–0.5)
EOSINOPHILS RELATIVE PERCENT: 5 %
HEMATOCRIT: 44.6 % (ref 39.0–48.0)
HEMOGLOBIN: 15 g/dL (ref 12.9–16.5)
LYMPHOCYTES ABSOLUTE COUNT: 3.6 10*9/L (ref 1.1–3.6)
LYMPHOCYTES RELATIVE PERCENT: 37.4 %
MEAN CORPUSCULAR HEMOGLOBIN CONC: 33.6 g/dL (ref 32.0–36.0)
MEAN CORPUSCULAR HEMOGLOBIN: 30.4 pg (ref 25.9–32.4)
MEAN CORPUSCULAR VOLUME: 90.7 fL (ref 77.6–95.7)
MEAN PLATELET VOLUME: 10.1 fL (ref 6.8–10.7)
MONOCYTES ABSOLUTE COUNT: 0.8 10*9/L (ref 0.3–0.8)
MONOCYTES RELATIVE PERCENT: 8.8 %
NEUTROPHILS ABSOLUTE COUNT: 4.6 10*9/L (ref 1.8–7.8)
NEUTROPHILS RELATIVE PERCENT: 48 %
PLATELET COUNT: 244 10*9/L (ref 150–450)
RED BLOOD CELL COUNT: 4.92 10*12/L (ref 4.26–5.60)
RED CELL DISTRIBUTION WIDTH: 13.6 % (ref 12.2–15.2)
WBC ADJUSTED: 9.6 10*9/L (ref 3.6–11.2)

## 2023-04-29 NOTE — Unmapped (Addendum)
Try the sinus rinse bottle--ideally you do it twice a day but start with once a day  When you get the new insurance, send [place] that information and we will start the process to get you Dupixent, an injectable to help certain people with COPD    Keep trying to increase your exercise/activity    Thank you for visiting the [facility]monary [facility]. You may receive a survey regarding your visit - we are very interested in your responses so we can continue to improve your clinic experience.     For medical emergencies: please call 911     For urgent medical issues after hours: [phone], [facility] Operator (ask for the pulmonary fellow on call)     For the COPD nurse, Selinda Eon, call [phone]    For pulmonary clinic appointments/Nurse line: please call [phone] or send a message through [facility]    For ANCA Vasculitis/Nephrology clinic questions and/or appointments: [phone]

## 2023-05-10 LAB — IGE: TOTAL IGE: 728 [IU]/mL — ABNORMAL HIGH (ref 2–214)

## 2023-06-15 DIAGNOSIS — E782 Mixed hyperlipidemia: Principal | ICD-10-CM

## 2023-06-15 DIAGNOSIS — I2510 Atherosclerotic heart disease of native coronary artery without angina pectoris: Principal | ICD-10-CM

## 2023-06-16 DIAGNOSIS — J42 Unspecified chronic bronchitis: Principal | ICD-10-CM

## 2023-06-16 MED ORDER — TRELEGY ELLIPTA 100 MCG-62.5 MCG-25 MCG POWDER FOR INHALATION
Freq: Every day | RESPIRATORY_TRACT | 5 refills | 60 days | Status: CP
Start: 2023-06-16 — End: ?

## 2023-06-17 DIAGNOSIS — E782 Mixed hyperlipidemia: Principal | ICD-10-CM

## 2023-06-17 DIAGNOSIS — I2510 Atherosclerotic heart disease of native coronary artery without angina pectoris: Principal | ICD-10-CM

## 2023-06-17 NOTE — Unmapped (Signed)
Patient requesting refill    Last visit: 04/29/2023  Next visit: 10/28/2023    RX routed to Dr. Orson Aloe for review    Requested Prescriptions     Pending Prescriptions Disp Refills    fluticasone-umeclidin-vilanter (TRELEGY ELLIPTA) 100-62.5-25 mcg inhaler 60 each 5     Sig: Inhale 1 puff daily.

## 2023-06-22 NOTE — Unmapped (Signed)
[facility] Delivery Pharm[facility] Refill Coordination Note    Specialty Lite Medication(s) to be Shipped:   Repatha    Other medication(s) to be shipped: No additional medications requested for fill at this time     Chase Mendoza, DOB: 1965/09/01  Phone: [phone] (home)       All above HIPAA information was verified with patient.     Was a Nurse, learning disability used for this call? No    Changes to medications: Xzavyer reports no changes at this time.  Changes to insurance: No      REFERRAL TO PHARMACIST     Referral to the pharmacist: Not needed      [facility]     Shipping address confirmed in Epic.     Delivery Scheduled: Yes, Expected medication delivery date: 06/25/23.     Medication will be delivered via UPS to the prescription address in Epic WAM.    Willette Pa   [facility] Specialty and Home Delivery Pharmacy Specialty Technician

## 2023-06-25 MED ORDER — DUPILUMAB 300 MG/2 ML SUBCUTANEOUS SYRINGE
SUBCUTANEOUS | 11 refills | 28.00 days | Status: CP
Start: 2023-06-25 — End: ?

## 2023-06-25 NOTE — Unmapped (Signed)
Patient is a candidate for dupilimab for better treatment of his COPD with frequent exacerbations and elevated eosinophils.   Will start the process for injections at home.

## 2023-07-01 DIAGNOSIS — J449 Chronic obstructive pulmonary disease, unspecified: Principal | ICD-10-CM

## 2023-07-07 NOTE — Unmapped (Signed)
[facility] Specialty Medication Onboarding    Specialty Medication: Dupixent  Prior Authorization: Approved   Financial Assistance: No - copay  <$25  Final Copay/Day Supply: $12.15 / 28 days    Insurance Restrictions: None     Notes to Pharmacist:   Credit Card on File: yes  Start Date on Rx:      The triage team has completed the benefits investigation and has determined that the patient is able to fill this medication at [facility]. Please contact the patient to complete the onboarding or follow up with the prescribing physician as needed.

## 2023-07-08 DIAGNOSIS — J449 Chronic obstructive pulmonary disease, unspecified: Principal | ICD-10-CM

## 2023-07-08 MED ORDER — EPINEPHRINE 0.3 MG/0.3 ML INJECTION, AUTO-INJECTOR
Freq: Once | INTRAMUSCULAR | 1 refills | 1.00 days | Status: CP
Start: 2023-07-08 — End: 2023-07-08
  Filled 2023-07-15: qty 0.6, 2d supply, fill #0

## 2023-07-08 MED ORDER — DUPIXENT 300 MG/2 ML SUBCUTANEOUS PEN INJECTOR
SUBCUTANEOUS | 11 refills | 0.00 days | Status: CP
Start: 2023-07-08 — End: ?
  Filled 2023-07-15: qty 4, 28d supply, fill #0

## 2023-07-08 NOTE — Unmapped (Signed)
[facility]    Patient Onboarding/Medication Counseling    Chase Mendoza is a 58 y.o. male with COPD who I am counseling today on initiation of therapy.  I am speaking to the patient.    Was a Nurse, learning disability used for this call? No    Verified patient's date of birth / HIPAA.    Specialty medication(s) to be sent: CF/Pulmonary/Asthma: Dupixent 300 mg/31mL and Specialty Lite: Repatha      Non-specialty medications/supplies to be sent: n/a      Medications not needed at this time: n/a         Dupixent (dupilumab)    Medication & Administration     Dosage: Chronic Obstructive Pulmonary Disease (COPD), refractory: Inject 300mg  under the skin every 2 weeks     Administration:     Dupixent Pen  1. Gather all supplies needed for injection on a clean, flat working surface: medication syringe removed from packaging, alcohol swab, sharps container, etc.  2. Look at the medication label - look for correct medication, correct dose, and check the expiration date  3. Look at the medication - the liquid in the pen should appear clear and colorless to pale yellow  4. Lay the pen on a flat surface and allow it to warm up to room temperature for at least 45 minutes  5. Select injection site - you can use the front of your thigh or your belly (but not the area 2 inches around your belly button); if someone else is giving you the injection you can also use your upper arm in the skin covering your triceps muscle  6. Prepare injection site - wash your hands and clean the skin at the injection site with an alcohol swab and let it air dry, do not touch the injection site again before the injection  7. Hold the middle of the body of the pen and gently pull the needle safety cap straight out. Be careful not to bend the needle. Do not remove until immediately prior to injection  8. Press the pen down onto the injection site at a 90 degree angle.   9. You will hear a click as the injection starts, and then a second click when the injection is ALMOST done. Keep holding the pen against the skin for 5 more seconds after the second click.   10. Check that the pen is empty by looking in the viewing window - the yellow indicator bar should be stopped, and should fill the window.   11. Remove the pen from the skin by lifting straight up.   12. Dispose of the used pen immediately in your sharps disposal container  13. If you see any blood at the injection site, press a cotton ball or gauze on the site and maintain pressure until the bleeding stops, do not rub the injection site    Adherence/Missed dose instructions:  If a dose is missed, administer within 7 days from the missed dose and then resume the original schedule. If the missed dose is not administered within 7 days, you can either wait until the next dose on the original schedule or take your dose now and resume every 14 days from the new injection date. Do not use 2 doses at the same time or extra doses.      Goals of Therapy     -Reduce impairment - maintenance of normal daily activities & optimization of lung function  -Prevention of recurrent exacerbations and need for  emergency department/hospital care  -Maintenance of effective psychosocial functioning    Side Effects & Monitoring Parameters     Injection site reaction (redness, irritation, inflammation localized to the site of administration)  Signs of a common cold - minor sore throat, runny or stuffy nose, etc.  Recurrence of cold sores (herpes simplex)      The following side effects should be reported to the provider:  Signs of a hypersensitivity reaction - rash; hives; itching; red, swollen, blistered, or peeling skin; wheezing; tightness in the chest or throat; difficulty breathing, swallowing, or talking; swelling of the mouth, face, lips, tongue, or throat; etc.  Eye pain or irritation or any visual disturbances  Shortness of breath or worsening of breathing      Contraindications, Warnings, & Precautions     Have your bloodwork checked as you have been told by your prescriber   Birth control pills and other hormone-based birth control may not work as well to prevent pregnancy  Talk with your doctor if you are pregnant, planning to become pregnant, or breastfeeding  Discuss the possible need for holding your dose(s) of Dupixent?? when a planned procedure is scheduled with the prescriber as it may delay healing/recovery timeline       Drug/Food Interactions     Medication list reviewed in Epic. The patient was instructed to inform the care team before taking any new medications or supplements. No drug interactions identified.   Talk with you prescriber or pharmacist before receiving any live vaccinations while taking this medication and after you stop taking it    Storage, Handling Precautions, & Disposal     Store this medication in the refrigerator.  Do not freeze  If needed, you may store at room temperature for up to 14 days  Store in original packaging, protected from light  Do not shake  Dispose of used syringes in a sharps disposal container            Current Medications (including OTC/herbals), Comorbidities and Allergies     Current Outpatient Medications   Medication Sig Dispense Refill    ADDERALL XR 30 mg 24 hr capsule Take 1 capsule (30 mg total) by mouth.      albuterol HFA 90 mcg/actuation inhaler Inhale 2 puffs every six (6) hours as needed for wheezing. 8.5 g 2    aspirin (ECOTRIN) 81 MG tablet Take 1 tablet (81 mg total) by mouth daily.      atorvastatin (LIPITOR) 80 MG tablet Take 1 tablet (80 mg total) by mouth daily. 90 tablet 3    carvedilol (COREG) 12.5 MG tablet Take 1 tablet (12.5 mg total) by mouth two (2) times a day. 180 tablet 3    dextroamphetamine-amphetamine (ADDERALL) 20 mg tablet       dupilumab (DUPIXENT) 300 mg/2 mL Syrg injection Inject the contents of 1 syringe  (300 mg total) under the skin every fourteen (14) days. 4 mL 11    empty container (SHARPS-A-GATOR DISPOSAL SYSTEM) Misc Use as directed for sharps disposal 1 each 2    evolocumab (REPATHA SURECLICK) 140 mg/mL PnIj Inject the contents of one pen (140 mg) under the skin every fourteen (14) days. 2 mL 11    evolocumab (REPATHA SURECLICK) 140 mg/mL PnIj Inject the contents of one pen (140 mg) under the skin every fourteen (14) days. 2 mL 5    ezetimibe (ZETIA) 10 mg tablet Take 1 tablet (10 mg total) by mouth daily. 90 tablet 3    fluticasone  propionate (FLONASE) 50 mcg/actuation nasal spray       fluticasone-umeclidin-vilanter (TRELEGY ELLIPTA) 100-62.5-25 mcg inhaler Inhale 1 puff daily. 60 each 5    hydrOXYzine (ATARAX) 50 MG tablet Take 1 tablet (50 mg total) by mouth Three (3) times a day as needed for itching. Unsure of dose      ipratropium-albuteroL (DUO-NEB) 0.5-2.5 mg/3 mL nebulizer USE 1 VIAL VIA NEBULIZER EVERY 6 HOURS AS NEEDED. 360 mL 6    lisinopril (PRINIVIL,ZESTRIL) 10 MG tablet Take 1 tablet (10 mg total) by mouth daily.      nitroglycerin (NITROSTAT) 0.4 MG SL tablet Place 1 tablet (0.4 mg total) under the tongue every five (5) minutes as needed for chest pain. Maximum of 3 doses in 15 minutes. 25 each 0    omeprazole (PRILOSEC) 20 MG capsule TAKE (1) CAPSULE BY MOUTH TWICE DAILY BEFORE MEALS 180 capsule 1    pregabalin (LYRICA) 50 MG capsule       QUEtiapine (SEROQUEL) 100 MG tablet Take 1 tablet (100 mg total) by mouth nightly. Takes full 100 mg as of Feb 2024      sildenafil (VIAGRA) 100 MG tablet Take 1 tablet (100 mg total) by mouth daily as needed for erectile dysfunction. 10 tablet 11    tamsulosin (FLOMAX) 0.4 mg capsule Take 2 capsules (0.8 mg total) by mouth daily. 180 capsule 3     No current facility-administered medications for this visit.       Allergies   Allergen Reactions    Lopid [Gemfibrozil] Other (See Comments)     Lab values were abnormal    Metoprolol Headache     migraine    Statins-Hmg-Coa Reductase Inhibitors     Lexapro [Escitalopram Oxalate] Other (See Comments) and Anxiety     depression Patient Active Problem List   Diagnosis    Dysphagia    GERD (gastroesophageal reflux disease)    Pulmonary emphysema (CMS-HCC)    Non-specific colitis    Essential hypertension    History of renal calculi    Tobacco use    Moderate single current episode of major depressive disorder (CMS-HCC)    Mixed simple and mucopurulent chronic bronchitis (CMS-HCC)    Fatigue    Chronic oral opiate use (suboxone)    Psychophysiological insomnia    Adenomatous polyp of colon    ADHD (attention deficit hyperactivity disorder)    Chronic obstructive pulmonary disease (CMS-HCC)    Abnormal TSH    Familial hypercholesterolemia    Normocytic anemia    Coronary artery disease involving native coronary artery of native heart without angina pectoris    Encounter for annual physical exam    Sick-euthyroid syndrome    Kidney stone    Prediabetes    Generalized abdominal pain    Hematuria    Polysubstance abuse (CMS-HCC)    Health care maintenance       Medication list has been reviewed and updated in Epic: Yes    Allergies have been reviewed and updated in Epic: Yes    Appropriateness of Therapy     Acute infections noted within Epic:  No active infections  Patient reported infection: None    Is the medication and dose appropriate based on diagnosis, medication list, comorbidities, allergies, medical history, patient???s ability to self-administer the medication, and therapeutic goals? Yes    Prescription has been clinically reviewed: Yes      Baseline Quality of Life Assessment      How many days over the past  month did your COPD  keep you from your normal activities? For example, brushing your teeth or getting up in the morning. Mr. Dashner states is experiences constant SOB  which impacts QoL    Financial Information     Medication Assistance provided: Prior Authorization    Anticipated copay of $12.15 / 28 days reviewed with patient. Verified delivery address.    Delivery Information     Scheduled delivery date: 07/16/23    Expected start date: 07/16/23    Medication will be delivered via UPS to the prescription address in [facility].  This shipment will not require a signature.      Explained the services we provide at [facility] and that each month we would call to set up refills.  Stressed importance of returning phone calls so that we could ensure they receive their medications in time each month.  Informed patient that we should be setting up refills 7-10 days prior to when they will run out of medication.  A pharmacist will reach out to perform a clinical assessment periodically.  Informed patient that a welcome packet, containing information about our pharmacy and other support services, a Notice of Privacy Practices, and a drug information handout will be sent.      The patient or caregiver noted above participated in the development of this care plan and knows that they can request review of or adjustments to the care plan at any time.      Patient or caregiver verbalized understanding of the above information as well as how to contact the pharmacy at [phone] option 4 with any questions/concerns.  The pharmacy is open Monday through Friday 8:30am-4:30pm.  A pharmacist is available 24/7 via pager to answer any clinical questions they may have.    Patient Specific Needs     Does the patient have any physical, cognitive, or cultural barriers? No    Does the patient have adequate living arrangements? (i.e. the ability to store and take their medication appropriately) Yes    Did you identify any home environmental safety or security hazards? No    Patient prefers to have medications discussed with  Patient     Is the patient or caregiver able to read and understand education materials at a high school level or above? Yes    Patient's primary language is  English     Is the patient high risk? No    Does the patient have an additional or emergency contact listed in their chart? Yes    SOCIAL DETERMINANTS OF HEALTH     At the [facility], we have learned that life circumstances - like trouble affording food, housing, utilities, or transportation can affect the health of many of our patients.   That is why we wanted to ask: are you currently experiencing any life circumstances that are negatively impacting your health and/or quality of life? Patient declined to answer    Social Drivers of Health     Food Insecurity: Unknown (05/12/2019)    Hunger Vital Sign     Worried About Running Out of Food in the Last Year: Patient declined     Ran Out of Food in the Last Year: Patient declined   Internet Connectivity: Not on file   Housing/Utilities: Unknown (09/14/2020)    Housing/Utilities     Within the past 12 months, have you ever stayed: outside, in a car, in a tent, in an overnight shelter, or temporarily in someone  else's home (i.e. couch-surfing)?: No     Are you worried about losing your housing?: Not on file     Within the past 12 months, have you been unable to get utilities (heat, electricity) when it was really needed?: Not on file   Tobacco Use: High Risk (04/29/2023)    Patient History     Smoking Tobacco Use: Every Day     Smokeless Tobacco Use: Former     Passive Exposure: Not on file   Transportation Needs: Unknown (05/12/2019)    PRAPARE - Therapist, art (Medical): Patient declined     Lack of Transportation (Non-Medical): Patient declined   Alcohol Use: Not on file   Interpersonal Safety: Not on file   Physical Activity: Not on file   Intimate Partner Violence: Not on file   Stress: Not on file   Substance Use: Not on file (04/14/2023)   Social Connections: Not on file   Financial Resource Strain: Low Risk  (05/12/2019)    Overall Financial Resource Strain (CARDIA)     Difficulty of Paying Living Expenses: Not hard at all   Depression: Not on file   Health Literacy: Low Risk  (09/14/2020)    Health Literacy     : Never       Would you be willing to receive help with any of the needs that you have identified today? Not applicable       Oliva Bustard, PharmD  [facility] Specialty Pharmacist

## 2023-07-08 NOTE — Unmapped (Signed)
SAVE IN RECORD FOR DR. Morrie Sheldon HENDERSON/PULMONARY    Hi Dr. Orson Aloe,     I am Culver's PCP out at [facility]. I know you guys can't see our records so I just wanted to reach out and let you know he has had several COPD exacerbations recently. We have really tried to push smoking cessation but he is having trouble quitting. We will continue to work on that.   He also had really elevated IgE levels but it looks like you guys got that done as well and are considering Dupixent. I wish our EHRs communicated better but please let me know if I can be of any help in his care.     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     Caleb A. Katrinka Blazing, MD   PGY-3 [facility]

## 2023-07-15 MED FILL — REPATHA SURECLICK 140 MG/ML SUBCUTANEOUS PEN INJECTOR: SUBCUTANEOUS | 28 days supply | Qty: 2 | Fill #1

## 2023-08-05 NOTE — Unmapped (Signed)
 [facility] Clinical Assessment & Refill Coordination Note    Chase Mendoza, DOB: August 05, 1965  Phone: [phone] (home)     All above HIPAA information was verified with patient.     Was a Nurse, learning disability used for this call? No    Specialty Medication(s):   CF/Pulmonary/Asthma: Dupixent 300 mg/81mL     Current Outpatient Medications   Medication Sig Dispense Refill    ADDERALL XR 30 mg 24 hr capsule Take 1 capsule (30 mg total) by mouth.      albuterol HFA 90 mcg/actuation inhaler Inhale 2 puffs every six (6) hours as needed for wheezing. 8.5 g 2    aspirin (ECOTRIN) 81 MG tablet Take 1 tablet (81 mg total) by mouth daily.      atorvastatin (LIPITOR) 80 MG tablet Take 1 tablet (80 mg total) by mouth daily. 90 tablet 3    carvedilol (COREG) 12.5 MG tablet Take 1 tablet (12.5 mg total) by mouth two (2) times a day. 180 tablet 3    dextroamphetamine-amphetamine (ADDERALL) 20 mg tablet       dupilumab (DUPIXENT PEN) 300 mg/2 mL PnIj Inject the contents of 1 pen (300 mg) under the skin every fourteen (14) days. 4 mL 11    empty container (SHARPS-A-GATOR DISPOSAL SYSTEM) Misc Use as directed for sharps disposal 1 each 2    EPINEPHrine (EPIPEN) 0.3 mg/0.3 mL injection Inject 0.3 mL (0.3 mg total) into the muscle once for 1 dose. 0.3 mL 1    evolocumab (REPATHA SURECLICK) 140 mg/mL PnIj Inject the contents of one pen (140 mg) under the skin every fourteen (14) days. 2 mL 11    evolocumab (REPATHA SURECLICK) 140 mg/mL PnIj Inject the contents of one pen (140 mg) under the skin every fourteen (14) days. 2 mL 5    ezetimibe (ZETIA) 10 mg tablet Take 1 tablet (10 mg total) by mouth daily. 90 tablet 3    fluticasone propionate (FLONASE) 50 mcg/actuation nasal spray       fluticasone-umeclidin-vilanter (TRELEGY ELLIPTA) 100-62.5-25 mcg inhaler Inhale 1 puff daily. 60 each 5    hydrOXYzine (ATARAX) 50 MG tablet Take 1 tablet (50 mg total) by mouth Three (3) times a day as needed for itching. Unsure of dose ipratropium-albuteroL (DUO-NEB) 0.5-2.5 mg/3 mL nebulizer USE 1 VIAL VIA NEBULIZER EVERY 6 HOURS AS NEEDED. 360 mL 6    lisinopril (PRINIVIL,ZESTRIL) 10 MG tablet Take 1 tablet (10 mg total) by mouth daily.      nitroglycerin (NITROSTAT) 0.4 MG SL tablet Place 1 tablet (0.4 mg total) under the tongue every five (5) minutes as needed for chest pain. Maximum of 3 doses in 15 minutes. 25 each 0    omeprazole (PRILOSEC) 20 MG capsule TAKE (1) CAPSULE BY MOUTH TWICE DAILY BEFORE MEALS 180 capsule 1    pregabalin (LYRICA) 50 MG capsule       QUEtiapine (SEROQUEL) 100 MG tablet Take 1 tablet (100 mg total) by mouth nightly. Takes full 100 mg as of Feb 2024      sildenafil (VIAGRA) 100 MG tablet Take 1 tablet (100 mg total) by mouth daily as needed for erectile dysfunction. 10 tablet 11    tamsulosin (FLOMAX) 0.4 mg capsule Take 2 capsules (0.8 mg total) by mouth daily. 180 capsule 3     No current facility-administered medications for this visit.        Changes to medications: Ladd reports no changes at this time.    Medication  list has been reviewed and updated in Epic: Yes    Allergies   Allergen Reactions    Lopid [Gemfibrozil] Other (See Comments)     Lab values were abnormal    Metoprolol Headache     migraine    Statins-Hmg-Coa Reductase Inhibitors     Lexapro [Escitalopram Oxalate] Other (See Comments) and Anxiety     depression       Changes to allergies: No    Allergies have been reviewed and updated in Epic: Yes    SPECIALTY MEDICATION ADHERENCE     Dupixent 300  mg/81mL : 0 doses of medicine on hand     Medication Adherence    Patient reported X missed doses in the last month: 0  Specialty Medication: Dupixent 300 mg/47mL Q14d  Patient is on additional specialty medications: No  Patient is on more than two specialty medications: No  Any gaps in refill history greater than 2 weeks in the last 3 months: no  Demonstrates understanding of importance of adherence: yes  Informant: patient          Specialty medication(s) dose(s) confirmed: Regimen is correct and unchanged.     Are there any concerns with adherence? No    Adherence counseling provided? Not needed    CLINICAL MANAGEMENT AND INTERVENTION      Clinical Benefit Assessment:    Do you feel the medicine is effective or helping your condition?  unsure    Clinical Benefit counseling provided? Reasonable expectations discussed: We discussed it may take up to 4 months to determine clinical benefit    Adverse Effects Assessment:    Are you experiencing any side effects? No    Are you experiencing difficulty administering your medicine? No    Quality of Life Assessment:    Quality of Life    Rheumatology  Oncology  Dermatology  Cystic Fibrosis          How many days over the past month did your COPD  keep you from your normal activities? For example, brushing your teeth or getting up in the morning. Patient declined to answer    Have you discussed this with your provider? Not needed    Acute Infection Status:    Acute infections noted within Epic:  No active infections    Patient reported infection: None    Therapy Appropriateness:    Is therapy appropriate based on current medication list, adverse reactions, adherence, clinical benefit and progress toward achieving therapeutic goals? Yes, therapy is appropriate and should be continued     Clinical Intervention:    Was an intervention completed as part of this clinical assessment? No    DISEASE/MEDICATION-SPECIFIC INFORMATION      For patients on injectable medications: Patient currently has 0 doses left.  Next injection is scheduled for ~3/10.    Asthma and Allergy: Have you had an asthma exacerbation in the last 30 days? No  Have you needed to use your rescue inhaler more often than usual in the last 30 days? No  Have you needed to take steroids for your asthma in the last 30 days? No    PATIENT SPECIFIC NEEDS     Does the patient have any physical, cognitive, or cultural barriers? No    Is the patient high risk? No    Does the patient require physician intervention or other additional services (i.e., nutrition, smoking cessation, social work)? No    Does the patient have an additional or emergency contact listed in  their chart? Yes    SOCIAL DETERMINANTS OF HEALTH     At the [facility], we have learned that life circumstances - like trouble affording food, housing, utilities, or transportation can affect the health of many of our patients.   That is why we wanted to ask: are you currently experiencing any life circumstances that are negatively impacting your health and/or quality of life? Patient declined to answer    Social Drivers of Health     Food Insecurity: Unknown (05/12/2019)    Hunger Vital Sign     Worried About Running Out of Food in the Last Year: Patient declined     Ran Out of Food in the Last Year: Patient declined   Internet Connectivity: Not on file   Housing/Utilities: Unknown (09/14/2020)    Housing/Utilities     Within the past 12 months, have you ever stayed: outside, in a car, in a tent, in an overnight shelter, or temporarily in someone else's home (i.e. couch-surfing)?: No     Are you worried about losing your housing?: Not on file     Within the past 12 months, have you been unable to get utilities (heat, electricity) when it was really needed?: Not on file   Tobacco Use: High Risk (04/29/2023)    Patient History     Smoking Tobacco Use: Every Day     Smokeless Tobacco Use: Former     Passive Exposure: Not on file   Transportation Needs: Unknown (05/12/2019)    PRAPARE - Therapist, art (Medical): Patient declined     Lack of Transportation (Non-Medical): Patient declined   Alcohol Use: Not on file   Interpersonal Safety: Not on file   Physical Activity: Not on file   Intimate Partner Violence: Not on file   Stress: Not on file   Substance Use: Not on file (04/14/2023)   Social Connections: Not on file   Financial Resource Strain: Low Risk  (05/12/2019)    Overall Financial Resource Strain (CARDIA)     Difficulty of Paying Living Expenses: Not hard at all   Depression: Not on file   Health Literacy: Low Risk  (09/14/2020)    Health Literacy     : Never       Would you be willing to receive help with any of the needs that you have identified today? Not applicable       SHIPPING     Specialty Medication(s) to be Shipped:   CF/Pulmonary/Asthma: Dupixent 300mg  and Specialty Lite: Repatha    Other medication(s) to be shipped: No additional medications requested for fill at this time     Changes to insurance: No    Delivery Scheduled: Yes, Expected medication delivery date: 08/13/23.     Medication will be delivered via UPS to the confirmed prescription address in [facility].    The patient will receive a drug information handout for each medication shipped and additional FDA Medication Guides as required.  Verified that patient has previously received a Conservation officer, historic buildings and a Surveyor, mining.    The patient or caregiver noted above participated in the development of this care plan and knows that they can request review of or adjustments to the care plan at any time.      All of the patient's questions and concerns have been addressed.    Oliva Bustard, PharmD   [facility] Specialty Pharmacist

## 2023-08-12 DIAGNOSIS — I2089 Chronic stable angina (CMS-HCC): Principal | ICD-10-CM

## 2023-08-12 MED ORDER — NITROGLYCERIN 0.4 MG SUBLINGUAL TABLET
SUBLINGUAL | 0 refills | 1.00 days | Status: CP | PRN
Start: 2023-08-12 — End: ?

## 2023-08-12 MED FILL — DUPIXENT 300 MG/2 ML SUBCUTANEOUS PEN INJECTOR: SUBCUTANEOUS | 28 days supply | Qty: 4 | Fill #1

## 2023-08-12 MED FILL — REPATHA SURECLICK 140 MG/ML SUBCUTANEOUS PEN INJECTOR: SUBCUTANEOUS | 28 days supply | Qty: 2 | Fill #2

## 2023-09-08 ENCOUNTER — Ambulatory Visit: Admit: 2023-09-08 | Discharge: 2023-09-09 | Attending: Adult Health | Primary: Adult Health

## 2023-09-08 DIAGNOSIS — I10 Essential (primary) hypertension: Principal | ICD-10-CM

## 2023-09-08 DIAGNOSIS — I25118 Atherosclerotic heart disease of native coronary artery with other forms of angina pectoris: Principal | ICD-10-CM

## 2023-09-08 DIAGNOSIS — Z720 Tobacco use: Principal | ICD-10-CM

## 2023-09-08 DIAGNOSIS — J42 Unspecified chronic bronchitis: Principal | ICD-10-CM

## 2023-09-08 DIAGNOSIS — E7801 Familial hypercholesterolemia: Principal | ICD-10-CM

## 2023-09-08 LAB — LDL CHOLESTEROL, DIRECT: LDL CHOLESTEROL DIRECT: 29 mg/dL

## 2023-09-08 MED ORDER — RANOLAZINE ER 500 MG TABLET,EXTENDED RELEASE,12 HR
ORAL_TABLET | Freq: Two times a day (BID) | ORAL | 6 refills | 30 days | Status: CP
Start: 2023-09-08 — End: 2024-09-07

## 2023-09-08 NOTE — Unmapped (Signed)
 DIVISION OF CARDIOLOGY   [facility] [employer], [place]                                                                         Date of Service:  09/08/2023     Assessment/Plan:     1. CAD  s/p PCI to RCA (03/2017) with 50% mLCX lesion which has been medically managed. Had progressive anginal sx and underwent PCI of ISR of mRCA stent at [facility] 04/14/21 (due to insurance). LCx dz similar to prior. Echo w/ normal LV & RV function.   Did not tolerate imdur (HA). Stable angina occurring primarily w/ stress 1-2x/wk. EKG today is nonischemic.  - continue ASA monotherapy (rash with plavix).   - carvedilol 12.5 mg BID  - lipid mgmt as per below  - trial ranexa 500 mg bid    2. Essential hypertension  Well controlled     3. Tobacco abuse disorder  1/2 ppd. I counseled him on the importance of quitting smoking but he remains precontemplative.      4. Familial hyperlipidemia   Continue atorvastatin 80, Zetia 10 mg, repatha.   Has had prior LDL>200, c/f familial HLD  LDL now at goal <70  - recheck LDL    Lab Results   Component Value Date    LDL 51.0 04/22/2022     5. COPD  moderate to severe COPD, cont'd smoking. Followed by pulmonology (Dr. Orson Aloe)    Return to clinic: Return in about 6 months (around 03/09/2024).      Subjective:   [facility] [place]  Patient ID: Chase Mendoza is an 58 y.o. male patient with CAD s/p PCI of RCA (04/2021), HTN, familial HLD, tobacco use, COPD, GERD who presents for follow-up.    History present illness:  Chase Mendoza is s/p PCI to RCA (03/2017) with 50% mLCX lesion which has been medically managed. Had progressive anginal sx and underwent PCI of ISR of mRCA stent at [facility] 04/14/21 (due to insurance). LCx dz similar to prior. He has not tolerated imdur.     Last seen in October 2024 by myself. Had excellent reduction of LDL w/ Repatha. Now on ASA monotherapy (rash w/ plavix).       Here today for f/up. Overall doing ok.   Chest pain has been infrequent - took NTG early last week. Mostly occurring with stress and sometimes exertion. Occurring 1-2 times per week. He states this is unchanged from prior. No swelling, palpitations, LH/dizziness. Can't walk far before getting SOB. Showering and putting on shoes makes him winded. This is stable. Now on dupilumab for COPD which has helped, less coughing.   No longer using cpap, had to send back.   Not ready to quit smoking, currently 1/2 ppd.     He is on disability now.   Mother and brother both died of MI.     Objective:   Physical Exam:  BP 123/77 (BP Position: Sitting)  - Pulse 83  - Wt 98.5 kg (217 lb 1.6 oz)  - SpO2 97%  - BMI 28.64 kg/m??   General-  Normal appearing male in no apparent distress.  Neurologic- Alert and oriented X3.  Cranial nerve II-XII grossly intact.  HEENT-  Normocephalic atraumatic head.  No scleral icterus.  MMM  Neck- Supple, no carotid bruis, no JVD  Lungs- Clear to auscultation, no wheezes, rhonchi, or rhales.  Prolonged expiratory phase.  Heart- RRR, nl S1S2, no MRG  Extremities-  No clubbing or cyanosis.  No LE edema  Pulses- +2 pulses in radial bilaterally.  Psych- Normal mood, appropriate.     Notable results:  Labs:   Lab Results   Component Value Date    WBC 9.6 04/29/2023    HGB 15.0 04/29/2023    HCT 44.6 04/29/2023    PLT 244 04/29/2023     Lab Results   Component Value Date    NA 138 04/25/2022    K 3.7 04/25/2022    CREATININE 0.86 04/25/2022      Lab Results   Component Value Date    CHOL 146 06/26/2021    LDL 51.0 04/22/2022    HDL 27 (L) 06/26/2021    TRIG 177 (H) 06/26/2021     Lab Results   Component Value Date    A1C 5.7 (H) 05/13/2019      Imaging/Other:  Electrocardiogram:    From 10/16/21 showed NSR.     03/11/23 - NSR    09/08/2023-NSR     Echo:  From 05/29/17 showed normal left ventricular systolic function, ejection fraction > 55%. Normal right ventricular systolic function. No significant valvular abnormalities.    From 08/25/21 showed LVEF 55%. The right ventricle is not well visualized but probably normal in size, with normal systolic function.     Nuclear Stress Test:  From 10/2017 was a normal myocardial perfusion study. No evidence for significant ischemia or scar noted. Post-stress EF > 65%.     From 05/26/19 showed normal myocardial perfusion study. No evidence for significant ischemia or scar is noted. Post stress: Global systolic function is normal. The ejection fraction calculated at 60%. Attenuation CT scan shows post PCI findings. CT finding show emphysematous and non-specific ground glass changes of the lungs.     Cardiac Catheterization:  From 03/2017 showed normal LV systolic function. Coronary artery disease including very long 50% mid-RCA stenosis (FFR = 0.80) and 50% mid-circumflex stenosis. Successful PCI of mid-RCA with placement of an Onyx drug eluting stent.     From [facility] 04/14/21 showed 2 vessel coronary artery disease.   Successful PCI of mid RCA. LCx disease is similar to 2018   ISR of mid RCA was target for intervention. 2.25 x 12-mm PTCA balloon, HD IVUS used for optimization. Prior stents were expanded and apposed. Diffuse ISR. 3.0 x 28-mm Synergy DES at 16 atm. 3.0 x 20-mm Gratiot Emerge used to post-dilate.        Glade Stanford, AGNP-C  Cardiology Nurse Practitioner  [facility]

## 2023-09-08 NOTE — Unmapped (Signed)
 Get labs    Start ranexa to help with chest pain

## 2023-09-15 ENCOUNTER — Emergency Department
Admit: 2023-09-15 | Discharge: 2023-09-16 | Disposition: A | Discharge status: Home / Self Care | Attending: Emergency Medicine

## 2023-09-15 ENCOUNTER — Emergency Department
Admit: 2023-09-15 | Discharge: 2023-09-16 | Disposition: A | Discharge status: Home / Self Care | Payer: Medicare (Managed Care) | Attending: Emergency Medicine

## 2023-09-15 DIAGNOSIS — I2510 Atherosclerotic heart disease of native coronary artery without angina pectoris: Principal | ICD-10-CM

## 2023-09-15 DIAGNOSIS — U071 COVID-19: Principal | ICD-10-CM

## 2023-09-15 DIAGNOSIS — E782 Mixed hyperlipidemia: Principal | ICD-10-CM

## 2023-09-15 DIAGNOSIS — J441 Chronic obstructive pulmonary disease with (acute) exacerbation: Principal | ICD-10-CM

## 2023-09-15 LAB — CBC W/ AUTO DIFF
BASOPHILS ABSOLUTE COUNT: 0.1 10*9/L (ref 0.0–0.1)
BASOPHILS RELATIVE PERCENT: 0.8 %
EOSINOPHILS ABSOLUTE COUNT: 0.6 10*9/L — ABNORMAL HIGH (ref 0.0–0.5)
EOSINOPHILS RELATIVE PERCENT: 5.8 %
HEMATOCRIT: 42.1 % (ref 39.0–48.0)
HEMOGLOBIN: 14.2 g/dL (ref 12.9–16.5)
LYMPHOCYTES ABSOLUTE COUNT: 3 10*9/L (ref 1.1–3.6)
LYMPHOCYTES RELATIVE PERCENT: 30 %
MEAN CORPUSCULAR HEMOGLOBIN CONC: 33.8 g/dL (ref 32.0–36.0)
MEAN CORPUSCULAR HEMOGLOBIN: 30 pg (ref 25.9–32.4)
MEAN CORPUSCULAR VOLUME: 88.8 fL (ref 77.6–95.7)
MEAN PLATELET VOLUME: 10.1 fL (ref 6.8–10.7)
MONOCYTES ABSOLUTE COUNT: 1.2 10*9/L — ABNORMAL HIGH (ref 0.3–0.8)
MONOCYTES RELATIVE PERCENT: 11.8 %
NEUTROPHILS ABSOLUTE COUNT: 5.1 10*9/L (ref 1.8–7.8)
NEUTROPHILS RELATIVE PERCENT: 51.6 %
NUCLEATED RED BLOOD CELLS: 0 /100{WBCs} (ref <=4)
PLATELET COUNT: 193 10*9/L (ref 150–450)
RED BLOOD CELL COUNT: 4.74 10*12/L (ref 4.26–5.60)
RED CELL DISTRIBUTION WIDTH: 14 % (ref 12.2–15.2)
WBC ADJUSTED: 10 10*9/L (ref 3.6–11.2)

## 2023-09-15 LAB — BASIC METABOLIC PANEL
ANION GAP: 12 mmol/L (ref 5–14)
BLOOD UREA NITROGEN: 7 mg/dL — ABNORMAL LOW (ref 9–23)
BUN / CREAT RATIO: 8
CALCIUM: 9.2 mg/dL (ref 8.7–10.4)
CHLORIDE: 104 mmol/L (ref 98–107)
CO2: 27 mmol/L (ref 20.0–31.0)
CREATININE: 0.87 mg/dL (ref 0.73–1.18)
EGFR CKD-EPI (2021) MALE: >90 mL/min/1.73m2 (ref >=60)
GLUCOSE RANDOM: 93 mg/dL (ref 70–179)
POTASSIUM: 3.4 mmol/L (ref 3.4–4.8)
SODIUM: 143 mmol/L (ref 135–145)

## 2023-09-15 LAB — HIGH SENSITIVITY TROPONIN I - SINGLE: HIGH SENSITIVITY TROPONIN I: 3 ng/L (ref <=53)

## 2023-09-15 MED ORDER — PREDNISONE 20 MG TABLET
ORAL_TABLET | Freq: Every day | ORAL | 0 refills | 5.00 days | Status: CP
Start: 2023-09-15 — End: 2023-09-20

## 2023-09-15 MED ORDER — DEXTROMETHORPHAN 5 MG-GUAIFENESIN 50 MG/5 ML ORAL LIQUID
ORAL | 0 refills | 0.00 days | Status: CP | PRN
Start: 2023-09-15 — End: ?

## 2023-09-15 MED ORDER — REPATHA SURECLICK 140 MG/ML SUBCUTANEOUS PEN INJECTOR
SUBCUTANEOUS | 5 refills | 28.00 days
Start: 2023-09-15 — End: ?

## 2023-09-15 MED ADMIN — ipratropium-albuterol (DUO-NEB) 0.5-2.5 mg/3 mL nebulizer solution 3 mL: 3 mL | RESPIRATORY_TRACT | @ 22:00:00 | Stop: 2023-09-15

## 2023-09-15 NOTE — Unmapped (Signed)
 [facility] Refill Coordination Note    Specialty Medication(s) to be Shipped:   CF/Pulmonary/Asthma: Dupixent and Specialty Lite: Repatha    Other medication(s) to be shipped: No additional medications requested for fill at this time     MALE Chase Mendoza, DOB: September 16, 1965  Phone: [phone] (home)       All above HIPAA information was verified with patient.     Was a Nurse, learning disability used for this call? No    Completed refill call assessment today to schedule patient's medication shipment from the [facility]  [phone]).  All relevant notes have been reviewed.     Specialty medication(s) and dose(s) confirmed: Regimen is correct and unchanged.   Changes to medications: Dyland reports no changes at this time.  Changes to insurance: No  New side effects reported not previously addressed with a pharmacist or physician: None reported  Questions for the pharmacist: No    Confirmed patient received a Conservation officer, historic buildings and a Surveyor, mining with first shipment. The patient will receive a drug information handout for each medication shipped and additional FDA Medication Guides as required.       DISEASE/MEDICATION-SPECIFIC INFORMATION        For patients on injectable medications: Patient currently has 0 doses left.  Next injection is scheduled for 4/11.    SPECIALTY MEDICATION ADHERENCE     Medication Adherence    Patient reported X missed doses in the last month: 0  Specialty Medication: DUPIXENT PEN 300 mg/2 mL Pnij (dupilumab)  Patient is on additional specialty medications: No              Were doses missed due to medication being on hold? No    DUPIXENT PEN 300 mg/2 mL Pnij (dupilumab)  : 0 doses of medicine on hand     REFERRAL TO PHARMACIST     Referral to the pharmacist: Not needed      SHIPPING     Shipping address confirmed in Epic.     Cost and Payment: Patient has a $0 copay, payment information is not required.    Delivery Scheduled: Yes, Expected medication delivery date: 4/11.  However, Rx request for refills was sent to the provider as there are none remaining.     Medication will be delivered via UPS to the prescription address in Epic [facility].    [facility]  Specialty Technician

## 2023-09-15 NOTE — Unmapped (Signed)
 [facility]  Emergency Department Provider Note      ED Clinical Impression      Final diagnoses:   COVID-19 (Primary)   COPD exacerbation          Impression, Medical Decision Making, Progress Notes and Critical Care      Impression, Differential Diagnosis and Plan of Care    Chase Mendoza is a 58 y.o. male with a PMH of CAD (s/p PCI to RCA in 2018 and 2022), HTN, HLD, COPD, migraines, tobacco use disorder who presents with 2 days of sore throat, headaches, and shortness of breath, found to be COVID-19 positive yesterday as described below.    Vitals on arrival notable for tachypnea with RR of 28, otherwise WNL. On exam, increased work of breathing. Significantly diminished lung sounds with expiratory wheeze throughout all lung fields.    Differential includes COPD exacerbation versus pneumonia versus CHF.  Given focal lung findings, lower suspicion for PE or ACS.    Plan to obtain EKG, CXR, and labs. Will treat patient with Duo-nebs.    Discussion of Management with other Physicians, QHP or Appropriate Source: N/A  Independent Interpretation of Studies: I have independently reviewed EKG and note normal sinus rhythm 82, normal axis, normal interval, no STEMI.. I have independently reviewed CXR and note no pneumonia.  External Records Reviewed: Patient's most recent outpatient clinic note (Cards Note 09/08/23 for history)  Escalation of Care, Consideration of Admission/Observation/Transfer: Admission not required. Appropriate for outpatient management.  Social determinants that significantly affected care: None applicable  History obtained from other sources: None    Additional Progress Notes    6:46 PM  Reassessed patient after he received duo-neb x2. Lungs sounds are significantly improved. His SpO2 remains in the high 90s on room air. However, he has continued to experience a persistent cough. Will trial a dose of Robitussin and reassess. CXR w/ no evidence of acute abnormalities. Labs without leukocytosis, anemia, electrolyte abnormalities, or kidney dysfunction. Troponin negative.     8:06 PM  Pt has not yet received Robitussin, as the ED has not received it from central pharmacy. However, he continues to report that his shortness of breath feels improved. His lungs sounds have also remained improved and his SpO2 is currently 95%. Provided the patient with supportive care instructions for his COVID-19 infection and recommended he follow-up with his primary care provider for ongoing management.  Patient is on multiple medications that would interfere with Paxlovid, I do not feel that stopping these medications would be in his best interest at this time.  Will provide with a prescription for Prednisone to help with a potential COPD exacerbation induced by his viral infection. Patient is amenable to discharge w/ this plan. Return precautions discussed w/ patient who expresses understanding and agreement to plan. They deny any questions or concerns. NAD noted upon discharge.    Portions of this record have been created using Scientist, clinical (histocompatibility and immunogenetics). Dictation errors have been sought, but may not have been identified and corrected.    See chart and resident provider documentation for details.    ____________________________________________         History        Reason for Visit  Shortness of Breath      HPI   Chase Mendoza is a 58 y.o. male with a PMH of CAD (s/p PCI to RCA in 2018 and 2022), HTN, HLD, COPD, migraines, tobacco use disorder who presents to the ED via EMS  for evaluation of 2 days of sore throat, headaches, and shortness of breath. He took a COVID-19 test at home yesterday, which resulted positive.  He has been using his inhalers regularly at home for his COPD, though this has not helped to improve his shortness of breath. He does not have a baseline O2 requirement. He received a dose of Solumedrol and an albuterol nebulizer en route to the ED with EMS and reports feeling improved after this.     Past Medical History:   Diagnosis Date    Blood in stool     Colon polyps 2016    adenomatous polyp    Constipation     COPD (chronic obstructive pulmonary disease)     Diarrhea     Diverticulosis of colon     GERD (gastroesophageal reflux disease) 2016    esophagitis    Hyperlipidemia     Hypertension     Kidney stones     Migraines        Patient Active Problem List   Diagnosis    Dysphagia    GERD (gastroesophageal reflux disease)    Pulmonary emphysema    Non-specific colitis    Essential hypertension    History of renal calculi    Tobacco use    Moderate single current episode of major depressive disorder (CMS-HCC)    Mixed simple and mucopurulent chronic bronchitis    Fatigue    Chronic oral opiate use (suboxone)    Psychophysiological insomnia    Adenomatous polyp of colon    ADHD (attention deficit hyperactivity disorder)    Chronic obstructive pulmonary disease    Abnormal TSH    Familial hypercholesterolemia    Normocytic anemia    Coronary artery disease of native artery of native heart with stable angina pectoris    Encounter for annual physical exam    Sick-euthyroid syndrome    Kidney stone    Prediabetes    Generalized abdominal pain    Hematuria    Polysubstance abuse    Health care maintenance       Past Surgical History:   Procedure Laterality Date    PR CATH PLACE/CORON ANGIO, IMG SUPER/INTERP,W LEFT HEART VENTRICULOGRAPHY N/A 04/02/2017    Procedure: CATH LEFT HEART CATHETERIZATION W INTERVENTION;  Surgeon: Harvie Liner, MD;  Location: [facility] CATH;  Service: Cardiology    PR COLONOSCOPY W/BIOPSY SINGLE/MULTIPLE N/A 07/05/2014    Procedure: COLONOSCOPY, FLEXIBLE, PROXIMAL TO SPLENIC FLEXURE; WITH BIOPSY, SINGLE OR MULTIPLE;  Surgeon: Lara Plants, MD;  Location: [facility] PROCEDURES [facility];  Service: Gastroenterology    PR ESOPHAGEAL MOTILITY STUDY, MANOMETRY N/A 07/04/2015    Procedure: ESOPHAGEAL MOTILITY STUDY W/INT & REP;  Surgeon: Nurse-Based Giproc;  [facility]: [facility];  Service: Gastroenterology    PR GERD TST W/ MUCOS IMPEDE ELECTROD,>1HR N/A 07/04/2015    Procedure: ESOPHAGEAL FUNCTION TEST, GASTROESOPHAGEAL REFLUX TEST W/ NASAL CATHETER INTRALUMINAL IMPEDANCE ELECTRODE(S) PLACEMENT, RECORDING, ANALYSIS AND INTERPRETATION; PROLONGED;  Surgeon: Nurse-Based Giproc;  Location: [facility];  Service: Gastroenterology    PR UP GI ENDOSCOPY,BALL DIL,30MM N/A 07/05/2014    Procedure: UGI ENDO; W/BALLOON DILAT ESOPHAGUS (<30MM DIAM);  Surgeon: Lara Plants, MD;  Location: [facility];  Service: Gastroenterology    PR UP GI ENDOSCOPY,BALL DIL,30MM N/A 02/01/2018    Procedure: UGI ENDO; W/BALLOON DILAT ESOPHAGUS (<30MM DIAM);  Surgeon: Dorthy Gavia, MD;  Location: [facility];  Service: Gastroenterology    PR  UP GI ENDOSCOPY,DILATN W GUIDE N/A 11/16/2014    Procedure: UGI ENDOSCOPY; WITH INSERTION OF GUIDE WIRE FOLLOWED BY DILATION OF ESOPHAGUS OVER GUIDE WIRE;  Surgeon: Aldon Ambrosia, MD;  Location: GI PROCEDURES [facility];  Service: Gastroenterology    PR UPPER GI ENDOSCOPY,BIOPSY N/A 07/05/2014    Procedure: UGI ENDOSCOPY; WITH BIOPSY, SINGLE OR MULTIPLE;  Surgeon: Lara Plants, MD;  Location: [facility];  Service: Gastroenterology    PR UPPER GI ENDOSCOPY,W/DIR SUBMUC INJ N/A 02/01/2018    Procedure: UGI ENDO, INCL ESOPH/STOMACH/& EITHER DUODENUM AND/OR JEJUNUM AS APPROP; WITH DIRECTED SUBMUCOSAL INJECTION;  Surgeon: Dorthy Gavia, MD;  [facility]: [facility];  Service: Gastroenterology    SALIVARY GLAND SURGERY         No current facility-administered medications for this encounter.    Current Outpatient Medications:     ADDERALL XR 30 mg 24 hr capsule, Take 1 capsule (30 mg total) by mouth., Disp: , Rfl:     albuterol HFA 90 mcg/actuation inhaler, Inhale 2 puffs every six (6) hours as needed for wheezing., Disp: 8.5 g, Rfl: 2    amlodipine (NORVASC) 5 MG tablet, AMLODIPINE BESYLATE 5 MG TABS, Disp: , Rfl:     aspirin (ECOTRIN) 81 MG tablet, Take 1 tablet (81 mg total) by mouth daily., Disp: , Rfl:     atorvastatin (LIPITOR) 80 MG tablet, Take 1 tablet (80 mg total) by mouth daily., Disp: 90 tablet, Rfl: 3    azelastine (ASTELIN) 137 mcg (0.1 %) nasal spray, AZELASTINE HCL 0.1 % SOLN, Disp: , Rfl:     carvedilol (COREG) 12.5 MG tablet, Take 1 tablet (12.5 mg total) by mouth two (2) times a day., Disp: 180 tablet, Rfl: 3    dextroamphetamine-amphetamine (ADDERALL) 20 mg tablet, , Disp: , Rfl:     dupilumab (DUPIXENT PEN) 300 mg/2 mL pen injector, Inject the contents of 1 pen (300 mg) under the skin every fourteen (14) days., Disp: 4 mL, Rfl: 11    empty container (SHARPS-A-GATOR DISPOSAL SYSTEM) Misc, Use as directed for sharps disposal, Disp: 1 each, Rfl: 2    EPINEPHrine (EPIPEN) 0.3 mg/0.3 mL injection, Inject 0.3 mL (0.3 mg total) into the muscle once for 1 dose., Disp: 0.3 mL, Rfl: 1    evolocumab (REPATHA SURECLICK) 140 mg/mL PnIj, Inject the contents of one pen (140 mg) under the skin every fourteen (14) days., Disp: 2 mL, Rfl: 11    ezetimibe (ZETIA) 10 mg tablet, Take 1 tablet (10 mg total) by mouth daily., Disp: 90 tablet, Rfl: 3    fluticasone propionate (FLONASE) 50 mcg/actuation nasal spray, , Disp: , Rfl:     fluticasone-umeclidin-vilanter (TRELEGY ELLIPTA) 100-62.5-25 mcg inhaler, Inhale 1 puff daily., Disp: 60 each, Rfl: 5    ipratropium-albuteroL (DUO-NEB) 0.5-2.5 mg/3 mL nebulizer, USE 1 VIAL VIA NEBULIZER EVERY 6 HOURS AS NEEDED., Disp: 360 mL, Rfl: 6    losartan (COZAAR) 50 MG tablet, 1 tablet (50 mg total) daily., Disp: , Rfl:     nitroglycerin (NITROSTAT) 0.4 MG SL tablet, Place 1 tablet (0.4 mg total) under the tongue every five (5) minutes as needed for chest pain. Maximum of 3 doses in 15 minutes., Disp: 25 each, Rfl: 0    omeprazole (PRILOSEC) 20 MG capsule, TAKE (1) CAPSULE BY MOUTH TWICE DAILY BEFORE MEALS, Disp: 180 capsule, Rfl: 1    pregabalin (LYRICA) 50 MG capsule, , Disp: , Rfl:     QUEtiapine (SEROQUEL) 100 MG tablet,  Take 1 tablet (100 mg total) by mouth nightly. Takes full 100 mg as of Feb 2024, Disp: , Rfl:     ranolazine (RANEXA) 500 MG 12 hr tablet, Take 1 tablet (500 mg total) by mouth two (2) times a day., Disp: 60 tablet, Rfl: 6    sildenafil (VIAGRA) 100 MG tablet, Take 1 tablet (100 mg total) by mouth daily as needed for erectile dysfunction., Disp: 10 tablet, Rfl: 11    tamsulosin (FLOMAX) 0.4 mg capsule, Take 2 capsules (0.8 mg total) by mouth daily., Disp: 180 capsule, Rfl: 3    Allergies  Lopid [gemfibrozil], Buprenorphine-naloxone, Metoprolol, Statins-hmg-coa reductase inhibitors, and Lexapro [escitalopram oxalate]    Family History   Problem Relation Age of Onset    Colorectal Cancer Neg Hx     Crohn's disease Neg Hx     Ulcerative colitis Neg Hx        Social History  Social History     Tobacco Use    Smoking status: Every Day     Current packs/day: 0.50     Average packs/day: 0.5 packs/day for 40.0 years (20.0 ttl pk-yrs)     Types: Cigarettes    Smokeless tobacco: Former     Types: Snuff     Quit date: 2021    Tobacco comments:     pt reports that he is down to 6 cigs/day- after meals   Vaping Use    Vaping status: Some Days    Substances: Nicotine   Substance Use Topics    Alcohol use: No     Alcohol/week: 0.0 standard drinks of alcohol    Drug use: Not Currently     Frequency: 2.0 times per week     Comment: has decreased MJ use down to 3x/week- previously documented as 14        Physical Exam     This provider entered the patient's room: YES    If this provider did not enter the room, a comprehensive physical exam was not able to be performed due to increased infection risk to themselves, other providers, staff and other patients), as well as to conserve personal protective equipment (PPE) utilization during the COVID-19 pandemic.    If this provider did enter the patient room, the following was PPE worn: N95, eye protection and gloves     BP 112/77  - Pulse 81  - Temp 36.1 ??C (96.9 ??F)  - Resp 28  - SpO2 99%     Constitutional: Alert and oriented. Well appearing. Increased work of breathing.   Eyes: Conjunctivae are normal.  ENT       Head: Normocephalic and atraumatic.       Nose: No congestion.       Mouth/Throat: Mucous membranes are moist.       Neck: No stridor.  Hematological/Lymphatic/Immunilogical: No cervical lymphadenopathy.  Cardiovascular: Normal rate, regular rhythm. Normal and symmetric distal pulses are present in all extremities.  Respiratory: Significantly diminished lung sounds with expiratory wheeze throughout all lung fields. Tachypneic.  Gastrointestinal: Soft and nontender. There is no CVA tenderness.  Musculoskeletal: Normal range of motion in all extremities.       Right lower leg: No tenderness or edema.       Left lower leg: No tenderness or edema.  Neurologic: Normal speech and language. No gross focal neurologic deficits are appreciated.  Skin: Skin is warm, dry and intact. No rash noted.  Psychiatric: Mood and affect are normal. Speech and behavior are normal.  Radiology     XR Chest Portable   Final Result   No radiographic evidence of acute cardiopulmonary pathology.              Procedures     N/A    Documentation assistance was provided by Rebekah Canada, Scribe, on September 15, 2023 at 5:11 PM for Tanja Fang, MD.    September 15, 2023 11:31 PM. Documentation assistance provided by the scribe. I was present during the time the encounter was recorded. The information recorded by the scribe was done at my direction and has been reviewed and validated by me.        French Jester, MD  09/15/23 [phone]

## 2023-09-15 NOTE — Unmapped (Signed)
 Bed: 01  Expected date: 09/15/23  Expected time: 4:54 PM  Means of arrival:   Comments:  ems

## 2023-09-15 NOTE — Unmapped (Addendum)
 Pt arrives with [facility] for c/o sob in setting of diagnosed with Covid.  Pt last took tylenol at 1300 today due to intermittent fever.  Pt was given 5mg  albuterol and 125mg  solumedrol PTA.

## 2023-09-16 MED ADMIN — dextromethorphan-guaiFENesin (ROBITUSSIN-DM) 2-20 mg/mL oral syrup: 10 mL | ORAL | Stop: 2023-09-15

## 2023-09-16 MED FILL — DUPIXENT 300 MG/2 ML SUBCUTANEOUS PEN INJECTOR: SUBCUTANEOUS | 28 days supply | Qty: 4 | Fill #2

## 2023-09-17 NOTE — Unmapped (Signed)
 Chase Mendoza 's Repatha  shipment will be delayed as a result of no refills remain on the prescription.      I have reached out to the patient  at [phone]  and communicated the delay. We will wait for a call back from the patient to reschedule the delivery. via ups.

## 2023-09-21 MED ORDER — REPATHA SURECLICK 140 MG/ML SUBCUTANEOUS PEN INJECTOR
SUBCUTANEOUS | 5 refills | 28.00 days
Start: 2023-09-21 — End: ?

## 2023-09-21 NOTE — Unmapped (Signed)
 Duplicate  There is already Rx from 03/2023 good through a year.

## 2023-09-23 MED ORDER — REPATHA SURECLICK 140 MG/ML SUBCUTANEOUS PEN INJECTOR
SUBCUTANEOUS | 11 refills | 28.00 days | Status: CP
Start: 2023-09-23 — End: ?
  Filled 2023-09-28: qty 6, 84d supply, fill #0

## 2023-10-12 NOTE — Unmapped (Signed)
 [facility] Refill Coordination Note    Specialty Medication(s) to be Shipped:   CF/Pulmonary/Asthma: Dupixent    Other medication(s) to be shipped: No additional medications requested for fill at this time     Chase Mendoza, DOB: 12-Mar-1966  Phone: [phone] (home)       All above HIPAA information was verified with patient.     Was a Nurse, learning disability used for this call? No    Completed refill call assessment today to schedule patient's medication shipment from the [facility]  [phone]).  All relevant notes have been reviewed.     Specialty medication(s) and dose(s) confirmed: Regimen is correct and unchanged.   Changes to medications: [name] reports no changes at this time.  Changes to insurance: No  New side effects reported not previously addressed with a pharmacist or physician: None reported  Questions for the pharmacist: No    Confirmed patient received a Conservation officer, historic buildings and a Surveyor, mining with first shipment. The patient will receive a drug information handout for each medication shipped and additional FDA Medication Guides as required.       DISEASE/MEDICATION-SPECIFIC INFORMATION        For patients on injectable medications: Patient currently has 0 doses left.  Next injection is scheduled for 10/15/23.    SPECIALTY MEDICATION ADHERENCE     Medication Adherence    Patient reported X missed doses in the last month: 0  Specialty Medication: DUPIXENT PEN 300 mg/2 mL pen injector (dupilumab)  Patient is on additional specialty medications: No  Patient is on more than two specialty medications: No              Were doses missed due to medication being on hold? No    DUPIXENT PEN 300 mg/2 mL pen injector (dupilumab)  : 0 doses of medicine on hand       REFERRAL TO PHARMACIST     Referral to the pharmacist: Not needed      SHIPPING     Shipping address confirmed in Epic.     Cost and Payment: Patient has a $0 copay, payment information is not required.    Delivery Scheduled: Yes, Expected medication delivery date: 10/15/23.     Medication will be delivered via UPS to the prescription address in Epic WAM.    Chase Mendoza   [facility]  Specialty Technician

## 2023-10-12 NOTE — Unmapped (Signed)
 Followed up with Chase Mendoza who reports he after taking one dose of ranexa, he felt his CP worsened. He reports he had CP daily for about 2 weeks after this, but is unsure if that is related to ranexa.  I told him we would reach back out after notifying his provider. Patient verbalized understanding.      Hi Chase Mendoza,     Just wanted to check in and see if the Ranexa has helped your chest pain at all.     Best, Cassie

## 2023-10-14 MED FILL — DUPIXENT 300 MG/2 ML SUBCUTANEOUS PEN INJECTOR: SUBCUTANEOUS | 28 days supply | Qty: 4 | Fill #3

## 2023-10-28 ENCOUNTER — Inpatient Hospital Stay: Admit: 2023-10-28 | Discharge: 2023-10-28 | Payer: Medicare (Managed Care)

## 2023-10-28 ENCOUNTER — Ambulatory Visit: Admit: 2023-10-28 | Discharge: 2023-10-28 | Payer: Medicare (Managed Care)

## 2023-10-28 DIAGNOSIS — G4733 Obstructive sleep apnea (adult) (pediatric): Principal | ICD-10-CM

## 2023-10-28 DIAGNOSIS — J410 Simple chronic bronchitis: Principal | ICD-10-CM

## 2023-10-28 MED ORDER — PREDNISONE 20 MG TABLET
ORAL_TABLET | Freq: Every day | ORAL | 0 refills | 5.00000 days | Status: CP
Start: 2023-10-28 — End: 2023-11-02

## 2023-10-28 NOTE — Unmapped (Signed)
 Pulmonary Clinic - Return Visit    Referring Physician :  Bartolo Bors  PCP:     [facility], [place].  Marygrace Snellen  Reason for Consult:   COPD    HISTORY:     History of Present Illness:  Mr. Isidro is a 58 y.o. male with a history of COPD (GOLD IIB), CAD s/p PCI to RCA in 2018, HTN, HLD, GERD, renal stones, persistent tobacco use (cigarettes)  whom we are seeing in consultation requested by Zymeir Salminen Glyn Marae Cottrell for evaluation of COPD management and worsening shortness of breath.    Pertinent History/Chart Review   Mr. Teschner was 1st seen in March 2023 by our pulmonary clinic. He presented with SOB with minimal activity, productive cough (white/clear), dizzy, wakes up at night bc can't breathe. His symptoms improved some with duo neb, more with Trelegy. He is a current smoker (20 pk/yr) who is trying to quit. Medication selection and treatment options limited by finances. Also followed by Cardiology with primary complaint being fatigue.    Exacerbations:  Nov 2023, approx sept 2024    Interim History Last seen May 2024    Still taking Trelegy--helps.  No problems taking it in.  Has a nebulizer at home with duoneb which he takes 2-3 times a day.  Had an exacerbation about 2 months ago and got steroids/abx which helped.  Really feels like steroids help more than anything.  He can take care of his ADLs, doesn't like doing the cooking bc it makees him tired.  Can take a flight of stairs but does get SOB when he does.    He has regular sinus issues that is year round.  He was referred to ENT but it was for his ears so he cancelled it.  Might be taking atrovent nasal spray (not on his list)    Has recently been approved for medicare that will start in January    No longer using his CPAP because he slept less with it, desepite following instructions.  Then he was told his insurance wouldn't pay for it unless he did another study. Couldn't do the full face mask and the nasal one blew his mouth open.  Never tried a chin strap.  Still smoking about 1/2 ppd    10/28/23:    History of Present Illness  SAW MENDENHALL is a 58 year old male with COPD and coronary artery disease who presents with persistent respiratory symptoms following a COVID-19 infection.    He has a history of COPD, coronary artery disease with stents, reflux disease, and hypertension. He started Dupixent two to three months ago and initially felt some improvement until contracting COVID-19 on September 15, 2023, which exacerbated his respiratory symptoms.    Following the COVID-19 infection, he experienced significant respiratory distress, requiring emergency room care with oxygen and steroids. He was discharged the same day with a five-day course of oral steroids. He continues to experience persistent shortness of breath, especially in the mornings, and a frequent cough producing clear, thick sputum.    He uses Trelegy daily, albuterol four to five times a day, and a nebulizer with DuoNeb three times a day. Despite these treatments, he continues to experience significant shortness of breath and difficulty breathing, particularly in the mornings, which he describes as the worst part of his day due to severe shortness of breath upon waking.    He has a history of smoking and is currently smoking eight to nine cigarettes a day,  although he is attempting to reduce his smoking. He previously used a CPAP machine for sleep apnea but discontinued it due to intolerance. He wakes up with chest pain and shortness of breath.  He has not tolerated wellbutrin or chantix, can't chew the gum and patches didn't work    The steroids administered during his COVID-19 treatment helped improve his energy levels and appetite. He is currently taking Dupixent every other Saturday and reports no significant issues with the injections. He also notes a reduction in nasal congestion since starting Dupixent.        Got chest pain with patches, can't tolerate Chantix or wellbutrin as it made is depression worse.    Didn't like pulm rehab--hurt his back.  Felt like it didn't help.      He usually takes seroquel to help him sleep as without it he won't go to sleep.        COPD Assessment Test  Cough: 5 (10/28/23 1145)  Phlegm: 4 (10/28/23 1145)  Chest Tightness: 3 (10/28/23 1145)  Dyspnea: 4 (10/28/23 1145)  Activity Level: 4 (10/28/23 1145)  Confidence: 1 (10/28/23 1145)  Sleep: 4 (10/28/23 1145)  Energy: 5 (10/28/23 1145)  CAT Total: 30 (10/28/23 1145)  Most recent MMRC dyspnea scale: Not Found Last [facility] date: Not Found    Date CAT [facility]   March 29, 2019  1    August 28, 2021  19     December 18, 2021 21    Oct 21, 2022 28    April 29, 2023 28    10/28/23 30          Past Medical History:  Past Medical History:   Diagnosis Date    Blood in stool     Colon polyps 2016    adenomatous polyp    Constipation     COPD (chronic obstructive pulmonary disease)       Diarrhea     Diverticulosis of colon     GERD (gastroesophageal reflux disease) 2016    esophagitis    Hyperlipidemia     Hypertension     Kidney stones     Migraines      Sleep study march 2023: (Titration study):  needs CPAP at 12, medium mask.       Past Surgical History:   Procedure Laterality Date    PR CATH PLACE/CORON ANGIO, IMG SUPER/INTERP,W LEFT HEART VENTRICULOGRAPHY N/A 04/02/2017    Procedure: CATH LEFT HEART CATHETERIZATION W INTERVENTION;  Surgeon: Harvie Liner, MD;  Location: [facility] CATH;  Service: Cardiology    PR COLONOSCOPY W/BIOPSY SINGLE/MULTIPLE N/A 07/05/2014    Procedure: COLONOSCOPY, FLEXIBLE, PROXIMAL TO SPLENIC FLEXURE; WITH BIOPSY, SINGLE OR MULTIPLE;  Surgeon: Lara Plants, MD;  Location: [facility] GI PROCEDURES [facility];  Service: Gastroenterology    PR ESOPHAGEAL MOTILITY STUDY, MANOMETRY N/A 07/04/2015    Procedure: ESOPHAGEAL MOTILITY STUDY W/INT & REP;  Surgeon: Nurse-Based Giproc;  Location: [facility];  Service: Gastroenterology    PR GERD TST W/ MUCOS IMPEDE ELECTROD,>1HR N/A 07/04/2015    Procedure: ESOPHAGEAL FUNCTION TEST, GASTROESOPHAGEAL REFLUX TEST W/ NASAL CATHETER INTRALUMINAL IMPEDANCE ELECTRODE(S) PLACEMENT, RECORDING, ANALYSIS AND INTERPRETATION; PROLONGED;  Surgeon: Nurse-Based Giproc;  Location: GI PROCEDURES [facility];  Service: Gastroenterology    PR UP GI ENDOSCOPY,BALL DIL,30MM N/A 07/05/2014    Procedure: UGI ENDO; W/BALLOON DILAT ESOPHAGUS (<30MM DIAM);  Surgeon: Lara Plants, MD;  Location: [facility] PROCEDURES [facility];  Service: Gastroenterology    PR UP  GI ENDOSCOPY,BALL DIL,30MM N/A 02/01/2018    Procedure: UGI ENDO; W/BALLOON DILAT ESOPHAGUS (<30MM DIAM);  Surgeon: Dorthy Gavia, MD;  Location: GI PROCEDURES [facility];  Service: Gastroenterology    PR UP GI ENDOSCOPY,DILATN W GUIDE N/A 11/16/2014    Procedure: UGI ENDOSCOPY; WITH INSERTION OF GUIDE WIRE FOLLOWED BY DILATION OF ESOPHAGUS OVER GUIDE WIRE;  Surgeon: Aldon Ambrosia, MD;  Location: GI PROCEDURES [facility];  Service: Gastroenterology    PR UPPER GI ENDOSCOPY,BIOPSY N/A 07/05/2014    Procedure: UGI ENDOSCOPY; WITH BIOPSY, SINGLE OR MULTIPLE;  Surgeon: Lara Plants, MD;  Location: [facility] PROCEDURES [facility];  Service: Gastroenterology    PR UPPER GI ENDOSCOPY,W/DIR SUBMUC INJ N/A 02/01/2018    Procedure: UGI ENDO, INCL ESOPH/STOMACH/& EITHER DUODENUM AND/OR JEJUNUM AS APPROP; WITH DIRECTED SUBMUCOSAL INJECTION;  Surgeon: Dorthy Gavia, MD;  Location: GI PROCEDURES [facility];  Service: Gastroenterology    SALIVARY GLAND SURGERY         Other History:  The social history and family history were personally reviewed and updated in the patient's electronic medical record.    Family History   Problem Relation Age of Onset    Colorectal Cancer Neg Hx     Crohn's disease Neg Hx     Ulcerative colitis Neg Hx      Social History     Socioeconomic History    Marital status: Married   Tobacco Use    Smoking status: Every Day     Current packs/day: 0.50     Average packs/day: 0.5 packs/day for 40.0 years (20.0 ttl pk-yrs)     Types: Cigarettes    Smokeless tobacco: Former     Types: Snuff     Quit date: 2021    Tobacco comments:     pt reports that he is down to 6 cigs/day- after meals   Vaping Use    Vaping status: Some Days    Substances: Nicotine   Substance and Sexual Activity    Alcohol use: No     Alcohol/week: 0.0 standard drinks of alcohol    Drug use: Not Currently     Frequency: 2.0 times per week     Comment: has decreased MJ use down to 3x/week- previously documented as 14    Sexual activity: Yes     Partners: Female   Social History Narrative    Was incarcerated 5 days just prior to admission. Denies any recent drug use other than marijuana, states that he remotely used cocaine and was previously incarcerated 6 years for drug charges.        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 (Reviewed 09/02/17)     Social Drivers of Health     Financial Resource Strain: Low Risk  (05/12/2019)    Overall Financial Resource Strain (CARDIA)     Difficulty of Paying Living Expenses: Not hard at all   Food Insecurity: Unknown (05/12/2019)    Hunger Vital Sign     Worried About Running Out of Food in the Last Year: Patient declined     Ran Out of Food in the Last Year: Patient declined   Transportation Needs: Unknown (05/12/2019)    PRAPARE - Therapist, art (Medical): Patient declined     Lack of Transportation (Non-Medical): Patient declined   Housing: Unknown (09/14/2020)    Housing     Within the past 12 months, have you ever  stayed: outside, in a car, in a tent, in an overnight shelter, or temporarily in someone else's home (i.e. couch-surfing)?: No       Home Medications:  Current Outpatient Medications on File Prior to Visit   Medication Sig Dispense Refill    ADDERALL XR 30 mg 24 hr capsule Take 1 capsule (30 mg total) by mouth.      amlodipine (NORVASC) 5 MG tablet AMLODIPINE BESYLATE 5 MG TABS      atorvastatin (LIPITOR) 80 MG tablet Take 1 tablet (80 mg total) by mouth daily. 90 tablet 3    azelastine (ASTELIN) 137 mcg (0.1 %) nasal spray AZELASTINE HCL 0.1 % SOLN      carvedilol (COREG) 12.5 MG tablet Take 1 tablet (12.5 mg total) by mouth two (2) times a day. 180 tablet 3    dextroamphetamine-amphetamine (ADDERALL) 20 mg tablet       dextromethorphan-guaiFENesin 5-50 mg/5 mL Liqd Take 5 mL by mouth every four (4) hours as needed (cought). 118 mL 0    dupilumab (DUPIXENT PEN) 300 mg/2 mL pen injector Inject the contents of 1 pen (300 mg) under the skin every fourteen (14) days. 4 mL 11    empty container (SHARPS-A-GATOR DISPOSAL SYSTEM) Misc Use as directed for sharps disposal 1 each 2    evolocumab (REPATHA SURECLICK) 140 mg/mL PnIj Inject the contents of one pen (140 mg) under the skin every fourteen (14) days. 2 mL 11    ezetimibe (ZETIA) 10 mg tablet Take 1 tablet (10 mg total) by mouth daily. 90 tablet 3    fluticasone propionate (FLONASE) 50 mcg/actuation nasal spray       fluticasone-umeclidin-vilanter (TRELEGY ELLIPTA) 100-62.5-25 mcg inhaler Inhale 1 puff daily. 60 each 5    ipratropium-albuteroL (DUO-NEB) 0.5-2.5 mg/3 mL nebulizer USE 1 VIAL VIA NEBULIZER EVERY 6 HOURS AS NEEDED. 360 mL 6    losartan (COZAAR) 50 MG tablet 1 tablet (50 mg total) daily.      nitroglycerin (NITROSTAT) 0.4 MG SL tablet Place 1 tablet (0.4 mg total) under the tongue every five (5) minutes as needed for chest pain. Maximum of 3 doses in 15 minutes. 25 each 0    omeprazole (PRILOSEC) 20 MG capsule TAKE (1) CAPSULE BY MOUTH TWICE DAILY BEFORE MEALS 180 capsule 1    pregabalin (LYRICA) 50 MG capsule       QUEtiapine (SEROQUEL) 100 MG tablet Take 1 tablet (100 mg total) by mouth nightly. Takes full 100 mg as of Feb 2024      ranolazine (RANEXA) 500 MG 12 hr tablet Take 1 tablet (500 mg total) by mouth two (2) times a day. 60 tablet 6    albuterol HFA 90 mcg/actuation inhaler Inhale 2 puffs every six (6) hours as needed for wheezing. 8.5 g 2    aspirin (ECOTRIN) 81 MG tablet Take 1 tablet (81 mg total) by mouth daily.      EPINEPHrine (EPIPEN) 0.3 mg/0.3 mL injection Inject 0.3 mL (0.3 mg total) into the muscle once for 1 dose. 0.3 mL 1     No current facility-administered medications on file prior to visit.       Allergies:  Allergies as of 10/28/2023 - Reviewed 10/28/2023   Allergen Reaction Noted    Lopid [gemfibrozil] Other (See Comments) 07/05/2014    Buprenorphine-naloxone Nausea And Vomiting 05/07/2022    Metoprolol Headache 04/01/2017    Statins-hmg-coa reductase inhibitors  10/30/2016    Lexapro [escitalopram oxalate] Other (See Comments) and Anxiety 07/04/2015  Review of Systems:  A comprehensive review of systems was completed and negative except as noted in HPI.    PHYSICAL EXAM:   BP 104/80 (BP Site: L Arm, BP Position: Sitting, BP Cuff Size: Large)  - Pulse 85  - Temp 36.2 ??C (97.1 ??F) (Temporal)  - Wt 96 kg (211 lb 9.6 oz)  - SpO2 94%  - BMI 27.92 kg/m??   GEN: NAD, sitting in chair looking straight ahead  EYES: EOMI, sclera anicteric  ENT: Trachea midline, MMM.  CV: RRR, no murmurs appreciated  PULM: course bilateral rhonichi with some improvement with coughing but also had some wheezing today.     EXT: No edema, no clubbing  NEURO: Grossly Non-focal, moving all extremities normally  PSYCH: A+Ox3, appropriate    LABORATORY and RADIOLOGY DATA:     Pulmonary Function Tests/Interpretation:    Date FEV1  (Pre/Post) FVC  (Pre/Post) FEV1/FVC  (Pre/Post) DLCO   07/31/21  1.97 (49.9%) / 2.01 (51.0%) 4.35 (85.4%) / 4.45 (87.3%) 45% / 45% 22.4 (75%)     Spirometry:  Spirometry shows moderate obstructive impairment. No improvement after inhaled bronchodilator  Flow Volume:  normal forced inspiratory flows.    Lung volume:  Lung volumes are consistent with hyperinflation and Lung volumes are consistent with gas trapping. (RV 232%, TLC 132%, FRC 186%)    DLCO:  DLCO is mildly reduced      : 05/12/22  Walked 442m, O2 saturation nadir was 97% on RA      Pertinent Laboratory Data:  10/16/21: normal folate, iron panel, ESR. Mildly low TSH (0.385). Nl hgb, wbc, plts     Pertinent Imaging Data:  Screening Chest CCT 07/17/21: mild emphysema and bronchial wall thickening, some mucous plugging and micronodules, Lung RADS 1--repeat in 12 months    Chest CT 10/22/22: Scattered micronodules, unchanged. No suspicious pulmonary nodules. .  Repeat in 12 months    V/Q/Spect: normal perfusion, changes c/w COPD and emphysema        Echo 08/25/21: normal EF, RV not well visualized but probably normal; difficult study    ASSESSMENT and PLAN     DAQUAWN SEELMAN is a 58 y.o. male with COPD, CAD s/p PCI to RCA in 2018, HTN, HLD, GERD, renal stones, persistent tobacco use (cigarettes) who is here for COPD management      Assessment & Plan  Chronic Obstructive Pulmonary Disease (COPD)  COPD exacerbation likely related to recent COVID-19 infection. Persistent dyspnea despite current inhaler regimen (Trelegy daily, albuterol 4-5 times daily) and nebulizer (DuoNeb 3 times daily). Increased cough with clear, thick sputum, especially in the mornings. Recent CT scan pending review. Previous steroid treatment during COVID-19 infection provided some relief. Smoking continues to contribute to symptoms.  - Prescribe 5-day course of oral prednisone to address COPD exacerbation.  - Review CT scan results once available to assess for lung cancer or other pulmonary issues.--see below. F/u in 1 year  - Consider switching to nebulized steroid if symptoms persist.  - Encourage smoking cessation; discuss potential for additional medication if smoking cessation is achieved.  - con't dupixent  - could consider chronic azithro if he would stop smoking  - full PFTs with f/u visit    COVID-19 infection  Recent COVID-19 infection on April 9th, leading to exacerbation of COPD symptoms. Hospitalized briefly for oxygen and steroid treatment. Persistent respiratory symptoms likely related to post-COVID effects.  --repeat 5d course of steroids for now; treating as a COPD exacerbation    Sleep  Apnea  Sleep apnea with previous CPAP intolerance. Discussed potential for Inspire device as an alternative treatment. Risks of untreated sleep apnea include right heart failure, hypertension, and depression. He is willing to attend sleep clinic for assessment.  - Refer to sleep clinic for assessment of candidacy for Inspire device.  - Educated on the importance of treating sleep apnea to prevent cardiovascular complications.      Addendum:  CT without any acute changes and no signs of malignancy or new nodules--f/u in 1 year      Return in about 4 months (around 02/28/2024).

## 2023-10-28 NOTE — Unmapped (Addendum)
 I have referred you to sleep clinic--the call will come from  neurology  If you can stop smoking, there is another drug we could consdier, but it doesn't work if still smoking  Con't the dupixent  I will call yo u with the results of your CT scan  Going to repeat breathing tests with your followup    Thank you for visiting the [facility]. You may receive a survey regarding your visit - we are very interested in your responses so we can continue to improve your clinic experience.     For medical emergencies: please call 911     For urgent medical issues after hours: [phone], [facility] Operator (ask for the pulmonary fellow on call)     For the COPD nurse, Malone Sear, call [phone]    For pulmonary clinic appointments/Nurse line: please call [phone] or send a message through [facility]    For ANCA Vasculitis/Nephrology clinic questions and/or appointments: [phone]      Are you interested in being part of research on lung disease?   If so, visit [email or web address] or scan the QR code below:

## 2023-11-05 ENCOUNTER — Inpatient Hospital Stay: Admit: 2023-11-05 | Discharge: 2023-11-06 | Payer: Medicare (Managed Care)

## 2023-11-15 NOTE — Unmapped (Signed)
 [facility] Refill Coordination Note    Specialty Medication(s) to be Shipped:   CF/Pulmonary/Asthma: Dupixent    Other medication(s) to be shipped: No additional medications requested for fill at this time     Chase Mendoza, DOB: Apr 13, 1966  Phone: [phone] (home)       All above HIPAA information was verified with patient.     Was a Nurse, learning disability used for this call? No    Completed refill call assessment today to schedule patient's medication shipment from the [facility] Delivery Pharm[facility]  [phone]).  All relevant notes have been reviewed.     Specialty medication(s) and dose(s) confirmed: Regimen is correct and unchanged.   Changes to medications: [name] reports no changes at this time.  Changes to insurance: No  New side effects reported not previously addressed with a pharmacist or physician: None reported  Questions for the pharmacist: No    Confirmed patient received a Conservation officer, historic buildings and a Surveyor, mining with first shipment. The patient will receive a drug information handout for each medication shipped and additional FDA Medication Guides as required.       DISEASE/MEDICATION-SPECIFIC INFORMATION        For patients on injectable medications: Patient currently has 0 doses left.  Next injection is scheduled for 0614.    SPECIALTY MEDICATION ADHERENCE     Medication Adherence    Patient reported X missed doses in the last month: 0  Specialty Medication: DUPIXENT PEN 300 mg/2 mL pen injector (dupilumab)  Patient is on additional specialty medications: No              Were doses missed due to medication being on hold? No    DUPIXENT PEN 300 mg/2 mL pen injector (dupilumab) : 0 doses of medicine on hand     REFERRAL TO PHARMACIST     Referral to the pharmacist: Not needed      SHIPPING     Shipping address confirmed in Epic.     Cost and Payment: Patient has a $0 copay, payment information is not required.    Delivery Scheduled: Yes, Expected medication delivery date: 06/12.     Medication will be delivered via UPS to the prescription address in Epic WAM.    Chase Mendoza   [place] [facility]  Specialty Technician

## 2023-11-17 MED FILL — DUPIXENT 300 MG/2 ML SUBCUTANEOUS PEN INJECTOR: SUBCUTANEOUS | 28 days supply | Qty: 4 | Fill #4

## 2023-12-09 NOTE — Unmapped (Signed)
 [facility] Delivery Pharm[facility] Refill Coordination Note    Specialty Medication(s) to be Shipped:   CF/Pulmonary/Asthma: Dupixent     Other medication(s) to be shipped: No additional medications requested for fill at this time     Chase Mendoza, DOB: 03/17/1966  Phone: [phone] (home)       All above HIPAA information was verified with patient.     Was a Nurse, learning disability used for this call? No    Completed refill call assessment today to schedule patient's medication shipment from the [facility] and [facility] Delivery Pharmacy  [phone]).  All relevant notes have been reviewed.     Specialty medication(s) and dose(s) confirmed: Regimen is correct and unchanged.   Changes to medications: Chase Mendoza reports no changes at this time.  Changes to insurance: No  New side effects reported not previously addressed with a pharmacist or physician: None reported  Questions for the pharmacist: No    Confirmed patient received a Conservation officer, historic buildings and a Surveyor, mining with first shipment. The patient will receive a drug information handout for each medication shipped and additional FDA Medication Guides as required.       DISEASE/MEDICATION-SPECIFIC INFORMATION        For patients on injectable medications: Patient currently has 0 doses left.  Next injection is scheduled for 12/18/23.    SPECIALTY MEDICATION ADHERENCE     Medication Adherence    Patient reported X missed doses in the last month: 0  Specialty Medication: DUPIXENT  PEN 300 mg/2 mL pen injector (dupilumab )  Patient is on additional specialty medications: No  Patient is on more than two specialty medications: No  Any gaps in refill history greater than 2 weeks in the last 3 months: no  Demonstrates understanding of importance of adherence: yes              Were doses missed due to medication being on hold? No    DUPIXENT  PEN 300   mg/ml: 0 days of medicine on hand       REFERRAL TO PHARMACIST     Referral to the pharmacist: Not needed      [employer] Shipping address confirmed in Epic.     Cost and Payment: Patient has a $0 copay, payment information is not required.    Delivery Scheduled: Yes, Expected medication delivery date: 12/15/23.     Medication will be delivered via UPS to the prescription address in Epic WAM.    Chase Mendoza   [facility] Delivery Pharm[facility]  Specialty Technician

## 2023-12-14 MED FILL — DUPIXENT 300 MG/2 ML SUBCUTANEOUS PEN INJECTOR: SUBCUTANEOUS | 28 days supply | Qty: 4 | Fill #5

## 2024-01-05 NOTE — Unmapped (Signed)
 [facility] Clinical Assessment & Refill Coordination Note    Chase Mendoza, DOB: November 30, 1965  Phone: [phone] (home)     All above HIPAA information was verified with patient.     Was a Nurse, learning disability used for this call? No    Specialty Medication(s):   CF/Pulmonary/Asthma: Dupixent      Current Medications[1]     Changes to medications: Odie reports no changes at this time.    Medication list has been reviewed and updated in Epic: Yes    Allergies[2]    Changes to allergies: No    Allergies have been reviewed and updated in Epic: Yes    SPECIALTY MEDICATION ADHERENCE     Dupixent  300 mg/46mL: 0 doses of medicine on hand     Medication Adherence    Patient reported X missed doses in the last month: 0  Specialty Medication: Dupixent  300 mg/2mL Q14d  Patient is on additional specialty medications: No  Patient is on more than two specialty medications: No  Any gaps in refill history greater than 2 weeks in the last 3 months: no  Demonstrates understanding of importance of adherence: yes  Informant: patient          Specialty medication(s) dose(s) confirmed: Regimen is correct and unchanged.     Are there any concerns with adherence? No    Adherence counseling provided? Not needed    CLINICAL MANAGEMENT AND INTERVENTION      Clinical Benefit Assessment:    Do you feel the medicine is effective or helping your condition? Yes    Clinical Benefit counseling provided? Progress note from 5/22 shows evidence of clinical benefit    Adverse Effects Assessment:    Are you experiencing any side effects? No    Are you experiencing difficulty administering your medicine? No    Quality of Life Assessment:    Quality of Life    Rheumatology  Oncology  Dermatology  Cystic Fibrosis          How many days over the past month did your COPD  keep you from your normal activities? For example, brushing your teeth or getting up in the morning. Patient declined to answer    Have you discussed this with your provider? Not needed    Acute Infection Status:    Acute infections noted within Epic:  No active infections    Patient reported infection: None    Therapy Appropriateness:    Is therapy appropriate based on current medication list, adverse reactions, adherence, clinical benefit and progress toward achieving therapeutic goals? Yes, therapy is appropriate and should be continued     Clinical Intervention:    Was an intervention completed as part of this clinical assessment? No    DISEASE/MEDICATION-SPECIFIC INFORMATION      For patients on injectable medications: Patient currently has 0 doses left.  Next injection is scheduled for 8/4.    Asthma/COPD: Have you had an asthma exacerbation in the last 30 days? No  Have you needed to use your rescue inhaler more often than usual in the last 30 days? No  Have you needed to take steroids for your asthma in the last 30 days? No    PATIENT SPECIFIC NEEDS     Does the patient have any physical, cognitive, or cultural barriers? No    Is the patient high risk? No    Does the patient require physician intervention or other additional services (i.e., nutrition, smoking cessation, social work)? No  Does the patient have an additional or emergency contact listed in their chart? Yes    SOCIAL DETERMINANTS OF HEALTH     At the [facility], we have learned that life circumstances - like trouble affording food, housing, utilities, or transportation can affect the health of many of our patients.   That is why we wanted to ask: are you currently experiencing any life circumstances that are negatively impacting your health and/or quality of life? Patient declined to answer    Social Drivers of Health     Food Insecurity: Unknown (05/12/2019)    Hunger Vital Sign     Worried About Running Out of Food in the Last Year: Patient declined     Ran Out of Food in the Last Year: Patient declined   Tobacco Use: High Risk (10/28/2023)    Patient History     Smoking Tobacco Use: Every Day Smokeless Tobacco Use: Former     Passive Exposure: Not on file   Transportation Needs: Unknown (05/12/2019)    PRAPARE - Therapist, art (Medical): Patient declined     Lack of Transportation (Non-Medical): Patient declined   Alcohol Use: Not on file   Housing: Unknown (09/14/2020)    Housing     Within the past 12 months, have you ever stayed: outside, in a car, in a tent, in an overnight shelter, or temporarily in someone else's home (i.e. couch-surfing)?: No     Are you worried about losing your housing?: Not on file   Physical Activity: Not on file   Utilities: Not on file   Stress: Not on file   Interpersonal Safety: Not on file   Substance Use: Not on file (04/14/2023)   Intimate Partner Violence: Not on file   Social Connections: Not on file   Financial Resource Strain: Low Risk  (05/12/2019)    Overall Financial Resource Strain (CARDIA)     Difficulty of Paying Living Expenses: Not hard at all   Health Literacy: Low Risk  (09/14/2020)    Health Literacy     : Never   Internet Connectivity: Not on file       Would you be willing to receive help with any of the needs that you have identified today? Not applicable       SHIPPING     Specialty Medication(s) to be Shipped:   CF/Pulmonary/Asthma: Dupixent  and Specialty Lite: Repatha     Other medication(s) to be shipped: No additional medications requested for fill at this time     Changes to insurance: No    Cost and Payment: Patient has a $0 copay, payment information is not required.    Delivery Scheduled: Yes, Expected medication delivery date: 01/07/24.     Medication will be delivered via UPS to the confirmed prescription address in [facility].    The patient will receive a drug information handout for each medication shipped and additional FDA Medication Guides as required.  Verified that patient has previously received a Conservation officer, historic buildings and a Surveyor, mining.    The patient or caregiver noted above participated in the development of this care plan and knows that they can request review of or adjustments to the care plan at any time.      All of the patient's questions and concerns have been addressed.    Shelba Moats, PharmD   [facility] Specialty Pharmacist       [1]  Current Outpatient Medications   Medication Sig Dispense Refill    ADDERALL XR 30 mg 24 hr capsule Take 1 capsule (30 mg total) by mouth.      albuterol  HFA 90 mcg/actuation inhaler Inhale 2 puffs every six (6) hours as needed for wheezing. 8.5 g 2    amlodipine (NORVASC) 5 MG tablet AMLODIPINE BESYLATE 5 MG TABS      aspirin  (ECOTRIN) 81 MG tablet Take 1 tablet (81 mg total) by mouth daily.      atorvastatin  (LIPITOR) 80 MG tablet Take 1 tablet (80 mg total) by mouth daily. 90 tablet 3    azelastine (ASTELIN) 137 mcg (0.1 %) nasal spray AZELASTINE HCL 0.1 % SOLN      carvedilol  (COREG ) 12.5 MG tablet Take 1 tablet (12.5 mg total) by mouth two (2) times a day. 180 tablet 3    dextroamphetamine-amphetamine (ADDERALL) 20 mg tablet       dextromethorphan -guaiFENesin  5-50 mg/5 mL Liqd Take 5 mL by mouth every four (4) hours as needed (cought). 118 mL 0    dupilumab  (DUPIXENT  PEN) 300 mg/2 mL pen injector Inject the contents of 1 pen (300 mg) under the skin every fourteen (14) days. 4 mL 11    empty container (SHARPS-A-GATOR DISPOSAL SYSTEM) Misc Use as directed for sharps disposal 1 each 2    EPINEPHrine  (EPIPEN ) 0.3 mg/0.3 mL injection Inject 0.3 mL (0.3 mg total) into the muscle once for 1 dose. 0.3 mL 1    evolocumab  (REPATHA  SURECLICK) 140 mg/mL PnIj Inject the contents of one pen (140 mg) under the skin every fourteen (14) days. 2 mL 11    ezetimibe  (ZETIA ) 10 mg tablet Take 1 tablet (10 mg total) by mouth daily. 90 tablet 3    fluticasone propionate (FLONASE) 50 mcg/actuation nasal spray       fluticasone-umeclidin-vilanter (TRELEGY ELLIPTA ) 100-62.5-25 mcg inhaler Inhale 1 puff daily. 60 each 5    ipratropium-albuteroL  (DUO-NEB) 0.5-2.5 mg/3 mL nebulizer USE 1 VIAL VIA NEBULIZER EVERY 6 HOURS AS NEEDED. 360 mL 6    losartan (COZAAR) 50 MG tablet 1 tablet (50 mg total) daily.      nitroglycerin  (NITROSTAT ) 0.4 MG SL tablet Place 1 tablet (0.4 mg total) under the tongue every five (5) minutes as needed for chest pain. Maximum of 3 doses in 15 minutes. 25 each 0    omeprazole  (PRILOSEC) 20 MG capsule TAKE (1) CAPSULE BY MOUTH TWICE DAILY BEFORE MEALS 180 capsule 1    pregabalin (LYRICA) 50 MG capsule       QUEtiapine (SEROQUEL) 100 MG tablet Take 1 tablet (100 mg total) by mouth nightly. Takes full 100 mg as of Feb 2024      ranolazine  (RANEXA ) 500 MG 12 hr tablet Take 1 tablet (500 mg total) by mouth two (2) times a day. 60 tablet 6     No current facility-administered medications for this visit.   [2]   Allergies  Allergen Reactions    Lopid [Gemfibrozil] Other (See Comments)     Lab values were abnormal    Buprenorphine-Naloxone Nausea And Vomiting    Metoprolol  Headache     migraine    Statins-Hmg-Coa Reductase Inhibitors     Lexapro [Escitalopram Oxalate] Other (See Comments) and Anxiety     depression

## 2024-01-06 MED FILL — DUPIXENT 300 MG/2 ML SUBCUTANEOUS PEN INJECTOR: SUBCUTANEOUS | 28 days supply | Qty: 4 | Fill #6

## 2024-01-06 MED FILL — REPATHA SURECLICK 140 MG/ML SUBCUTANEOUS PEN INJECTOR: SUBCUTANEOUS | 28 days supply | Qty: 2 | Fill #1

## 2024-01-11 DIAGNOSIS — E7801 Familial hypercholesterolemia: Principal | ICD-10-CM

## 2024-01-11 MED ORDER — EZETIMIBE 10 MG TABLET
ORAL_TABLET | Freq: Every day | ORAL | 3 refills | 90.00000 days | Status: CP
Start: 2024-01-11 — End: ?

## 2024-02-02 NOTE — Unmapped (Signed)
 [facility] Refill Coordination Note    Specialty Medication(s) to be Shipped:   Inflammatory Disorders: Dupixent     Other medication(s) to be shipped: Repatha     Specialty Medications not needed at this time: N/A     Chase Mendoza, DOB: [birth year]  Phone: [phone] (home)       All above HIPAA information was verified with patient.     Was a Nurse, learning disability used for this call? No    Completed refill call assessment today to schedule patient's medication shipment from the [facility]  [phone]).  All relevant notes have been reviewed.     Specialty medication(s) and dose(s) confirmed: Regimen is correct and unchanged.   Changes to medications: Colsen reports no changes at this time.  Changes to insurance: No  New side effects reported not previously addressed with a pharmacist or physician: None reported  Questions for the pharmacist: No    Confirmed patient received a Conservation officer, historic buildings and a Surveyor, mining with first shipment. The patient will receive a drug information handout for each medication shipped and additional FDA Medication Guides as required.       DISEASE/MEDICATION-SPECIFIC INFORMATION        For patients on injectable medications: Next injection is scheduled for 9/8.    SPECIALTY MEDICATION ADHERENCE     Medication Adherence    Patient reported X missed doses in the last month: 0  Specialty Medication: DUPIXENT  PEN 300 mg/2 mL  Patient is on additional specialty medications: No              Were doses missed due to medication being on hold? No    DUPIXENT  PEN 300 mg/2 mL   : 0 doses of medicine on hand       REFERRAL TO PHARMACIST     Referral to the pharmacist: Not needed      SHIPPING     Shipping address confirmed in Epic.     Cost and Payment: Patient has a $0 copay, payment information is not required.    Delivery Scheduled: Yes, Expected medication delivery date: 9/3.     Medication will be delivered via UPS to the prescription address in Epic [facility].    Dorothyann JAYSON Lands   [facility]  Specialty Technician

## 2024-02-02 NOTE — Unmapped (Signed)
 The [facility] has made a second and final attempt to reach this patient to refill the following medication:Dupixent .      We have left voicemails on the following phone numbers: Phone: [phone], have been unable to leave messages on the following phone numbers: [phone], have sent a MyChart message, have sent a text message to the following phone numbers: Phone: [phone], and have sent a Mychart questionnaire..    Dates contacted: 08/21 08/27  Last scheduled delivery: 08/01      The patient may be at risk of non-compliance with this medication. The patient should call the [facility] at [phone]  Option 4, then Option 3: Allergy, Immunology, Pulmonary, Neurology to refill medication.    Chase Mendoza   [facility]

## 2024-02-08 MED FILL — REPATHA SURECLICK 140 MG/ML SUBCUTANEOUS PEN INJECTOR: SUBCUTANEOUS | 28 days supply | Qty: 2 | Fill #2

## 2024-02-08 MED FILL — DUPIXENT 300 MG/2 ML SUBCUTANEOUS PEN INJECTOR: SUBCUTANEOUS | 28 days supply | Qty: 4 | Fill #7

## 2024-02-10 DIAGNOSIS — E782 Mixed hyperlipidemia: Principal | ICD-10-CM

## 2024-02-10 DIAGNOSIS — I25118 Atherosclerotic heart disease of native coronary artery with other forms of angina pectoris: Principal | ICD-10-CM

## 2024-03-01 NOTE — Unmapped (Addendum)
 Pulmonary Clinic - Return Visit    Referring Physician :  Rosina Trenton Sauers  PCP:     [facility], [place].  Almarie Andes  Reason for Consult:   COPD    HISTORY:     History of Present Illness:  Mr. Zenz is a 58 y.o. male with a history of COPD (GOLD IIB), CAD s/p PCI to RCA in 2018, HTN, HLD, GERD, renal stones, persistent tobacco use (cigarettes)  whom we are seeing in consultation requested by Malan Werk Glyn Travez Stancil for evaluation of COPD management and worsening shortness of breath.    Pertinent History/Chart Review   Mr. Curt was 1st seen in March 2023 by our pulmonary clinic. He presented with SOB with minimal activity, productive cough (white/clear), dizzy, wakes up at night bc can't breathe. His symptoms improved some with duo neb, more with Trelegy. He is a current smoker (20 pk/yr) who is trying to quit. Medication selection and treatment options limited by finances. Also followed by Cardiology with primary complaint being fatigue.    Exacerbations:  Nov 2023, approx sept 2024, April 2025    Interim History 03/02/2024  Worsened SOB since he was diagnosed with COVID April 2025. He gets SOB with eating, dressing up. Denies SOB at rest. Still smoking, half a pack a day maximum. Has tried quitting but difficult- unable to tolerate Chantix  or patches and declines smoking cessation program. He prefers not to talk about smoking cessation as it puts his mind to it and rather increases the urge to smoke. Takes one puff of marijuana every night to help him sleep. Using duonebs 3-4x a day, and albuterol  inhaler 4-5times a day. States he is adherent to Trelegy and does have a taste in his mouth after use. He admits to cough, productive of scanty white sputum occasionally, with no changes in sputum quality, quantity or frequency. Denies fevers or chest pain. Has acid reflux for which he is on omeprazole . Denies sick contacts. He declines pulmonary rehab at this time as he states it has not been helpful in the past. PFTs today show worsened lung volumes from previous in Feb 2023. Okay with flu shot today but prefers to follow up on COVID with PCP. States he took one dose of Zoster vaccine but cannot remember when. Discussed possible IP appointment for discussion for valve placement given very high lung volumes- patient is open to this.     COPD Assessment Test  Cough: 4 (03/02/24 1116)  Chest Tightness: 3 (03/02/24 1116)  Dyspnea: 4 (03/02/24 1116)  Activity Level: 1 (03/02/24 1116)  Confidence: 3 (03/02/24 1116)  Sleep: 2 (03/02/24 1116)  Energy: 5 (03/02/24 1116)  Most recent MMRC dyspnea scale: Not Found Last [facility] date: Not Found    ____________________________________________________    COPD Care Checklist  Alpha-1 Antitrypsin Testing: MM 2023    Lung Cancer Screening: LDCT 10/28/2023, due in 1 year    Smoking Status:  still smoking. Half a pack a day.  Got chest pain with patches, can't tolerate Chantix  or wellbutrin  as it made is depression worse.    Vaccinations checked: yes    Pulmonary Rehab referral:  Didn't like pulm rehab--hurt his back.  Felt like it didn't help.  Declines one today    Last blood gas: No results found for: BGASARTTC    He usually takes seroquel to help him sleep as without it he won't go to sleep.           Most recent MMRC dyspnea scale:  Not Found Last [facility] date: Not Found    Date CAT [facility]   March 29, 2019  1    August 28, 2021  19     December 18, 2021 21    Oct 21, 2022 28    April 29, 2023 28    10/28/23 30    03/02/2024 22 (missing one) 4         Past Medical History:  Past Medical History:   Diagnosis Date    Blood in stool     Colon polyps 2016    adenomatous polyp    Constipation     COPD (chronic obstructive pulmonary disease)    (CMS-HCC)     Diarrhea     Diverticulosis of colon     GERD (gastroesophageal reflux disease) 2016    esophagitis    Hyperlipidemia     Hypertension     Kidney stones     Migraines      Sleep study march 2023: (Titration study):  needs CPAP at 12, medium mask.       Past Surgical History:   Procedure Laterality Date    PR CATH PLACE/CORON ANGIO, IMG SUPER/INTERP,W LEFT HEART VENTRICULOGRAPHY N/A 04/02/2017    Procedure: CATH LEFT HEART CATHETERIZATION W INTERVENTION;  Surgeon: Zachary Prentice Car, MD;  Location: [facility] CATH;  Service: Cardiology    PR COLONOSCOPY W/BIOPSY SINGLE/MULTIPLE N/A 07/05/2014    Procedure: COLONOSCOPY, FLEXIBLE, PROXIMAL TO SPLENIC FLEXURE; WITH BIOPSY, SINGLE OR MULTIPLE;  Surgeon: Eleanor CHRISTELLA Sorrel, MD;  Location: [facility] GI PROCEDURES [facility];  Service: Gastroenterology    PR ESOPHAGEAL MOTILITY STUDY, MANOMETRY N/A 07/04/2015    Procedure: ESOPHAGEAL MOTILITY STUDY W/INT & REP;  Surgeon: Nurse-Based Giproc;  [facility]: [facility];  Service: Gastroenterology    PR GERD TST W/ MUCOS IMPEDE ELECTROD,>1HR N/A 07/04/2015    Procedure: ESOPHAGEAL FUNCTION TEST, GASTROESOPHAGEAL REFLUX TEST W/ NASAL CATHETER INTRALUMINAL IMPEDANCE ELECTRODE(S) PLACEMENT, RECORDING, ANALYSIS AND INTERPRETATION; PROLONGED;  Surgeon: Nurse-Based Giproc;  Location: GI [facility]URES [facility];  Service: Gastroenterology    PR UP GI ENDOSCOPY,BALL DIL,30MM N/A 07/05/2014    Procedure: UGI ENDO; W/BALLOON DILAT ESOPHAGUS (<30MM DIAM);  Surgeon: Eleanor CHRISTELLA Sorrel, MD;  Location: [facility];  Service: Gastroenterology    PR UP GI ENDOSCOPY,BALL DIL,30MM N/A 02/01/2018    Procedure: UGI ENDO; W/BALLOON DILAT ESOPHAGUS (<30MM DIAM);  Surgeon: Lorrene Georgi Rase, MD;  Location: [facility];  Service: Gastroenterology    PR UP GI ENDOSCOPY,DILATN W GUIDE N/A 11/16/2014    Procedure: UGI ENDOSCOPY; WITH INSERTION OF GUIDE WIRE FOLLOWED BY DILATION OF ESOPHAGUS OVER GUIDE WIRE;  Surgeon: Bernardino JONETTA Pence, MD;  Location: GI PROCEDURES [facility];  Service: Gastroenterology    PR UPPER GI ENDOSCOPY,BIOPSY N/A 07/05/2014    Procedure: UGI ENDOSCOPY; WITH BIOPSY, SINGLE OR MULTIPLE;  Surgeon: Eleanor CHRISTELLA Sorrel, MD;  Location: [facility] PROCED[facility];  Service: Gastroenterology    PR UPPER GI ENDOSCOPY,W/DIR SUBMUC INJ N/A 02/01/2018    Procedure: UGI ENDO, INCL ESOPH/STOMACH/& EITHER DUODENUM AND/OR JEJUNUM AS APPROP; WITH DIRECTED SUBMUCOSAL INJECTION;  Surgeon: Lorrene Georgi Rase, MD;  Location: [facility];  Service: Gastroenterology    SALIVARY GLAND SURGERY         Other History:  The social history and family history were personally reviewed and updated in the patient's electronic medical record.    Family History   Problem Relation Age of Onset    Colorectal Cancer  Neg Hx     Crohn's disease Neg Hx     Ulcerative colitis Neg Hx      Social History     Socioeconomic History    Marital status: Married   Tobacco Use    Smoking status: Every Day     Current packs/day: 0.50     Average packs/day: 0.5 packs/day for 40.0 years (20.0 ttl pk-yrs)     Types: Cigarettes    Smokeless tobacco: Former     Types: Snuff     Quit date: 2021    Tobacco comments:     pt reports that he is down to 6 cigs/day- after meals   Vaping Use    Vaping status: Some Days    Substances: Nicotine    Substance and Sexual Activity    Alcohol use: No     Alcohol/week: 0.0 standard drinks of alcohol    Drug use: Not Currently     Frequency: 2.0 times per week     Comment: has decreased MJ use down to 3x/week- previously documented as 14    Sexual activity: Yes     Partners: Female   Social History Narrative    Was incarcerated 5 days just prior to admission. Denies any recent drug use other than marijuana, states that he remotely used cocaine and was previously incarcerated 6 years for drug charges.        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 (Reviewed 09/02/17)     Social Drivers of Health     Financial Resource Strain: Low Risk  (05/12/2019)    Overall Financial Resource Strain (CARDIA)     Difficulty of Paying Living Expenses: Not hard at all   Food Insecurity: Unknown (05/12/2019)    Hunger Vital Sign     Worried About Programme researcher, broadcasting/film/video in the Last Year: Patient declined     Ran Out of Food in the Last Year: Patient declined   Transportation Needs: Unknown (05/12/2019)    PRAPARE - Therapist, art (Medical): Patient declined     Lack of Transportation (Non-Medical): Patient declined   Housing: Unknown (09/14/2020)    Housing     Within the past 12 months, have you ever stayed: outside, in a car, in a tent, in an overnight shelter, or temporarily in someone else's home (i.e. couch-surfing)?: No       Home Medications:  Current Outpatient Medications on File Prior to Visit   Medication Sig Dispense Refill    ADDERALL XR 30 mg 24 hr capsule Take 1 capsule (30 mg total) by mouth.      albuterol  HFA 90 mcg/actuation inhaler Inhale 2 puffs every six (6) hours as needed for wheezing. 8.5 g 2    amlodipine (NORVASC) 5 MG tablet AMLODIPINE BESYLATE 5 MG TABS      aspirin  (ECOTRIN) 81 MG tablet Take 1 tablet (81 mg total) by mouth daily.      atorvastatin  (LIPITOR) 80 MG tablet Take 1 tablet (80 mg total) by mouth daily. 90 tablet 3    azelastine (ASTELIN) 137 mcg (0.1 %) nasal spray AZELASTINE HCL 0.1 % SOLN      carvedilol  (COREG ) 12.5 MG tablet Take 1 tablet (12.5 mg total) by mouth two (2) times a day. 180 tablet 3    dextroamphetamine-amphetamine (ADDERALL) 20 mg tablet       dextromethorphan -guaiFENesin  5-50 mg/5 mL Liqd Take 5 mL by mouth every  four (4) hours as needed (cought). 118 mL 0    dupilumab  (DUPIXENT  PEN) 300 mg/2 mL pen injector Inject the contents of 1 pen (300 mg) under the skin every fourteen (14) days. 4 mL 11    empty container (SHARPS-A-GATOR DISPOSAL SYSTEM) Misc Use as directed for sharps disposal 1 each 2    EPINEPHrine  (EPIPEN ) 0.3 mg/0.3 mL injection Inject 0.3 mL (0.3 mg total) into the muscle once for 1 dose. 0.3 mL 1    evolocumab  (REPATHA  SURECLICK) 140 mg/mL PnIj Inject the contents of one pen (140 mg) under the skin every fourteen (14) days. 2 mL 11    ezetimibe  (ZETIA ) 10 mg tablet Take 1 tablet (10 mg total) by mouth daily. 90 tablet 3    fluticasone propionate (FLONASE) 50 mcg/actuation nasal spray       fluticasone-umeclidin-vilanter (TRELEGY ELLIPTA ) 100-62.5-25 mcg inhaler Inhale 1 puff daily. 60 each 5    ipratropium-albuteroL  (DUO-NEB) 0.5-2.5 mg/3 mL nebulizer USE 1 VIAL VIA NEBULIZER EVERY 6 HOURS AS NEEDED. 360 mL 6    losartan (COZAAR) 50 MG tablet 1 tablet (50 mg total) daily.      nitroglycerin  (NITROSTAT ) 0.4 MG SL tablet Place 1 tablet (0.4 mg total) under the tongue every five (5) minutes as needed for chest pain. Maximum of 3 doses in 15 minutes. 25 each 0    omeprazole  (PRILOSEC) 20 MG capsule TAKE (1) CAPSULE BY MOUTH TWICE DAILY BEFORE MEALS 180 capsule 1    pregabalin (LYRICA) 50 MG capsule       QUEtiapine (SEROQUEL) 100 MG tablet Take 1 tablet (100 mg total) by mouth nightly. Takes full 100 mg as of Feb 2024      ranolazine  (RANEXA ) 500 MG 12 hr tablet Take 1 tablet (500 mg total) by mouth two (2) times a day. 60 tablet 6     No current facility-administered medications on file prior to visit.       Allergies:  Allergies as of 03/02/2024 - Reviewed 02/02/2024   Allergen Reaction Noted    Lopid [gemfibrozil] Other (See Comments) 07/05/2014    Buprenorphine-naloxone Nausea And Vomiting 05/07/2022    Metoprolol  Headache 04/01/2017    Statins-hmg-coa reductase inhibitors  10/30/2016    Lexapro [escitalopram oxalate] Other (See Comments) and Anxiety 07/04/2015       Review of Systems:  A comprehensive review of systems was completed and negative except as noted in HPI.    PHYSICAL EXAM:   There were no vitals taken for this visit.  GEN: NAD, sitting in chair looking straight ahead  EYES: EOMI, sclera anicteric  ENT: Trachea midline, MMM.  CV: RRR, no murmurs appreciated  PULM: Distant breath sounds     EXT: No edema, no clubbing  NEURO: Grossly Non-focal, moving all extremities normally  PSYCH: A+Ox3, appropriate    LABORATORY and RADIOLOGY DATA:     Pulmonary Function Tests/Interpretation:    Date FEV1  (Pre/Post) FVC  (Pre/Post) FEV1/FVC  (Pre/Post) DLCO   07/31/21  1.97 (49.9%) / 2.01 (51.0%) 4.35 (85.4%) / 4.45 (87.3%) 45% / 45% 22.4 (75%)   03/02/2024 1.72 (46%) 4.19 41 20.3 (68%)     Spirometry:  Spirometry shows moderate obstructive impairment. No improvement after inhaled bronchodilator  Flow Volume:  normal forced inspiratory flows.    Lung volume:  Lung volumes are consistent with hyperinflation and Lung volumes are consistent with gas trapping. (RV 232%, TLC 132%, FRC 186%)    DLCO:  DLCO is mildly reduced  : 05/12/22  Walked 477m, O2 saturation nadir was 97% on RA      Pertinent Laboratory Data:  10/16/21: normal folate, iron panel, ESR. Mildly low TSH (0.385). Nl hgb, wbc, plts     Pertinent Imaging Data:  Screening Chest CCT 07/17/21: mild emphysema and bronchial wall thickening, some mucous plugging and micronodules, Lung RADS 1--repeat in 12 months    Chest CT 10/22/22: Scattered micronodules, unchanged. No suspicious pulmonary nodules. .  Repeat in 12 months    Chest CT 10/28/2023: Change triangular 7 mm juxtapleural nodule in the posterior apical right upper lobe and additional smaller micronodules, favor intrapulmonary lymph nodes. No suspicious pulmonary nodules. Continue annual lung cancer screening with low-dose CT chest in 12 months.     V/Q/Spect: normal perfusion, changes c/w COPD and emphysema     Echo 08/25/21: normal EF, RV not well visualized but probably normal; difficult study  Echo 11/05/2023: left ventricular systolic function is normal, LVEF is visually estimated at > 55%. The right ventricle is normal in size, with normal systolic function.     ASSESSMENT and PLAN     JODEN BONSALL is a 58 y.o. male with COPD, CAD s/p PCI to RCA in 2018, HTN, HLD, GERD, renal stones, persistent tobacco use (cigarettes) who is here for COPD management.    #Severe COPD, Gold IIIB  -Stop Trelegy and start Breztri  160mcg 2 puffs BID with Spacer. Trelegy leaves taste in mouth with concern of inadequate amount of medication delivered to lungs  -Continue duonebs/albuterol  inhaler as rescue  -Continued discussion about pulmonary rehab should patient be interested   -Flu shot today. COVID vaccine with PCP (patient's preference)  -Discussed possible IP appointment for discussion for valve placement given very high lung volumes- patient is open to this. Referral will be sent for appointment.    #Current tobacco Smoker  -LDCT scan due May 2026  Continued discussion about smoking cessation      Return in about 3 months (around 06/01/2024) for Next scheduled follow up.    Patient seen, examined, and discussed with Dr. Charlott.

## 2024-03-02 ENCOUNTER — Ambulatory Visit: Admit: 2024-03-02 | Discharge: 2024-03-02 | Payer: Medicare (Managed Care)

## 2024-03-02 ENCOUNTER — Inpatient Hospital Stay: Admit: 2024-03-02 | Discharge: 2024-03-02 | Payer: Medicare (Managed Care)

## 2024-03-02 DIAGNOSIS — J449 Chronic obstructive pulmonary disease, unspecified: Principal | ICD-10-CM

## 2024-03-02 MED ORDER — BREZTRI AEROSPHERE 160 MCG-9MCG-4.8MCG/ACTUATION HFA AEROSOL INHALER
Freq: Two times a day (BID) | RESPIRATORY_TRACT | 11 refills | 0.00000 days | Status: CP
Start: 2024-03-02 — End: 2024-04-06

## 2024-03-02 NOTE — Unmapped (Signed)
 Flu vaccine given per order

## 2024-03-02 NOTE — Unmapped (Addendum)
 Hello Chase Mendoza,    It was great meeting you today. Please see our plan from today below:  - START Breztri  2puffs twice a day. This is a puffer and will get more medication delivered into your lungs. Please use with spacer and rinse mouth after each use  - STOP Trelegy as we are concerned you are not getting enough into your lungs   - Continue duonebs/albuterol  when needed   -We discussed smoking cessation and pulmonary rehab. Let us  know if you change your mind and need assistance.  -We also discussed possibility of placement of lung valves    Please return to clinic in three months.     If you have any non-urgent questions or concerns please reach out by calling the clinic [phone]) or via MyChart.    Donia Gentle, MD DrPH  Pulmonary and Critical Care Fellow

## 2024-03-02 NOTE — Unmapped (Signed)
I saw and evaluated the patient, participating in the key portions of the service.  I reviewed the Fellow's (subspecialty resident’s) note.  I agree with the Fellow's findings and plan.     Star Cheese G Reyli Schroth, MD

## 2024-03-03 DIAGNOSIS — R0600 Dyspnea, unspecified: Principal | ICD-10-CM

## 2024-03-03 DIAGNOSIS — J449 Chronic obstructive pulmonary disease, unspecified: Principal | ICD-10-CM

## 2024-03-03 DIAGNOSIS — R0602 Shortness of breath: Principal | ICD-10-CM

## 2024-03-07 NOTE — Unmapped (Signed)
 The [facility] has made a second and final attempt to reach this patient to refill the following medication:Dupixent .      We have left voicemails on the following phone numbers: Phone: [phone], have been unable to leave messages on the following phone numbers: [phone], have sent a MyChart message, have sent a text message to the following phone numbers: Phone: [phone], and have sent a Mychart questionnaire..    Dates contacted: 09/25 09/30  Last scheduled delivery: 09/02    The patient may be at risk of non-compliance with this medication. The patient should call the [facility] at [phone]  Option 4, then Option 3: Allergy, Immunology, Pulmonary, Neurology to refill medication.    Fender Herder   [facility]

## 2024-03-07 NOTE — Unmapped (Signed)
 [facility] Refill Coordination Note    Specialty Medication(s) to be Shipped:   CF/Pulmonary/Asthma: Dupixent     Other medication(s) to be shipped: repatha     Specialty Medications not needed at this time: N/A     Chase Mendoza, DOB: 1965/07/07  Phone: [phone] (home)       All above HIPAA information was verified with patient.     Was a Nurse, learning disability used for this call? No    Completed refill call assessment today to schedule patient's medication shipment from the [facility]  [phone]).  All relevant notes have been reviewed.     Specialty medication(s) and dose(s) confirmed: Regimen is correct and unchanged.   Changes to medications: Chase Mendoza reports no changes at this time.  Changes to insurance: No  New side effects reported not previously addressed with a pharmacist or physician: None reported  Questions for the pharmacist: No    Confirmed patient received a Conservation officer, historic buildings and a Surveyor, mining with first shipment. The patient will receive a drug information handout for each medication shipped and additional FDA Medication Guides as required.       DISEASE/MEDICATION-SPECIFIC INFORMATION        For patients on injectable medications: Next injection is scheduled for 10/07.    SPECIALTY MEDICATION ADHERENCE     Medication Adherence    Patient reported X missed doses in the last month: 0  Specialty Medication: DUPIXENT  PEN 300 mg/2 mL pen injector (dupilumab )  Patient is on additional specialty medications: No              Were doses missed due to medication being on hold? No    DUPIXENT  PEN 300 mg/2 mL pen injector (dupilumab ) : 0 doses of medicine on hand     REFERRAL TO PHARMACIST     Referral to the pharmacist: Not needed      SHIPPING     Shipping address confirmed in Epic.     Cost and Payment: Patient has a $0 copay, payment information is not required.    Delivery Scheduled: Yes, Expected medication delivery date: 10/03. Medication will be delivered via UPS to the prescription address in Epic WAM.    Chase Mendoza   [facility]  Specialty Technician

## 2024-03-09 ENCOUNTER — Ambulatory Visit
Admit: 2024-03-09 | Discharge: 2024-03-10 | Payer: Medicare (Managed Care) | Attending: Adult Health | Primary: Adult Health

## 2024-03-09 DIAGNOSIS — J42 Unspecified chronic bronchitis: Principal | ICD-10-CM

## 2024-03-09 DIAGNOSIS — Z720 Tobacco use: Principal | ICD-10-CM

## 2024-03-09 DIAGNOSIS — E782 Mixed hyperlipidemia: Principal | ICD-10-CM

## 2024-03-09 DIAGNOSIS — I25118 Atherosclerotic heart disease of native coronary artery with other forms of angina pectoris: Principal | ICD-10-CM

## 2024-03-09 DIAGNOSIS — J439 Emphysema, unspecified: Principal | ICD-10-CM

## 2024-03-09 DIAGNOSIS — I2089 Chronic stable angina: Principal | ICD-10-CM

## 2024-03-09 DIAGNOSIS — I10 Essential (primary) hypertension: Principal | ICD-10-CM

## 2024-03-09 DIAGNOSIS — E78019 Familial hypercholesterolemia, unspecified type: Principal | ICD-10-CM

## 2024-03-09 LAB — CBC
HEMATOCRIT: 41.6 % (ref 39.0–48.0)
HEMOGLOBIN: 14.3 g/dL (ref 12.9–16.5)
MEAN CORPUSCULAR HEMOGLOBIN CONC: 34.5 g/dL (ref 32.0–36.0)
MEAN CORPUSCULAR HEMOGLOBIN: 29.8 pg (ref 25.9–32.4)
MEAN CORPUSCULAR VOLUME: 86.3 fL (ref 77.6–95.7)
MEAN PLATELET VOLUME: 9.5 fL (ref 6.8–10.7)
PLATELET COUNT: 266 10*9/L (ref 150–450)
RED BLOOD CELL COUNT: 4.81 10*12/L (ref 4.26–5.60)
RED CELL DISTRIBUTION WIDTH: 14.3 % (ref 12.2–15.2)
WBC ADJUSTED: 12.2 10*9/L — ABNORMAL HIGH (ref 3.6–11.2)

## 2024-03-09 LAB — BASIC METABOLIC PANEL
ANION GAP: 7 mmol/L (ref 5–14)
BLOOD UREA NITROGEN: 7 mg/dL — ABNORMAL LOW (ref 9–23)
BUN / CREAT RATIO: 8
CALCIUM: 9.2 mg/dL (ref 8.7–10.4)
CHLORIDE: 106 mmol/L (ref 98–107)
CO2: 31.7 mmol/L — ABNORMAL HIGH (ref 20.0–31.0)
CREATININE: 0.84 mg/dL (ref 0.73–1.18)
EGFR CKD-EPI (2021) MALE: >90 mL/min/1.73m2 (ref >=60)
GLUCOSE RANDOM: 120 mg/dL (ref 70–179)
POTASSIUM: 3.4 mmol/L (ref 3.4–4.8)
SODIUM: 145 mmol/L (ref 135–145)

## 2024-03-09 MED ORDER — CARVEDILOL 12.5 MG TABLET
ORAL_TABLET | Freq: Two times a day (BID) | ORAL | 3 refills | 90.00000 days | Status: CP
Start: 2024-03-09 — End: ?

## 2024-03-09 MED ORDER — ATORVASTATIN 80 MG TABLET
ORAL_TABLET | Freq: Every day | ORAL | 3 refills | 90.00000 days | Status: CP
Start: 2024-03-09 — End: ?

## 2024-03-09 MED ORDER — NITROGLYCERIN 0.4 MG SUBLINGUAL TABLET
SUBLINGUAL | 0 refills | 1.00000 days | Status: CP | PRN
Start: 2024-03-09 — End: ?

## 2024-03-09 MED FILL — DUPIXENT 300 MG/2 ML SUBCUTANEOUS PEN INJECTOR: SUBCUTANEOUS | 28 days supply | Qty: 4 | Fill #8

## 2024-03-09 MED FILL — REPATHA SURECLICK 140 MG/ML SUBCUTANEOUS PEN INJECTOR: SUBCUTANEOUS | 28 days supply | Qty: 2 | Fill #3

## 2024-03-09 NOTE — Unmapped (Signed)
 VISIT SUMMARY:  You visited today due to worsening chest pain and difficulty breathing. We discussed your heart condition and breathing issues, and planned further tests and treatments.    YOUR PLAN:  ATHEROSCLEROTIC HEART DISEASE WITH ANGINA PECTORIS: You have been experiencing more frequent and severe chest pain, similar to what you felt before your last heart catheterization.  -We will order a heart catheterization to check for any new blockages or issues with your stent.  -We will check your labs before the catheterization.    EMPHYSEMA: Your breathing difficulties have worsened, especially during meals, and you are using your rescue inhaler more often.  -Continue your efforts to quit smoking.  -You are scheduled for an echocardiogram next week to further assess your condition.    TOBACCO USE: You are still smoking less than half a pack a day, which is contributing to your coughing and breathing difficulties.  -It is very important to quit smoking to help manage your heart disease and emphysema.

## 2024-03-09 NOTE — Unmapped (Signed)
 DIVISION OF CARDIOLOGY   [facility] [place]                                                                         Date of Service:  03/09/2024     Assessment/Plan:     1. CAD  s/p PCI to RCA (03/2017) with 50% mLCX lesion which has been medically managed. S/p PCI of ISR of mRCA stent at [facility] 04/14/21 (due to insurance). LCx dz similar to prior. Echo w/ normal LV & RV function.   Did not tolerate imdur  (HA) or ranexa . Has had worsening stable angina w/ NTG use - has had some episodes w/ unstable features (woke up this AM w/ chest pain). EKG today is nonischemic.  - continue ASA monotherapy (rash with plavix ).   - carvedilol  12.5 mg BID  - lipid mgmt as per below  - echo scheduled next week per pulm, cc'd on results  - arrange for repeat LHC   - refilled NTG    2. Essential hypertension  Well controlled     3. Tobacco abuse disorder  <1/2 ppd. I counseled him on the importance of quitting smoking but he remains precontemplative.       4. Familial hyperlipidemia   Continue atorvastatin  80, Zetia  10 mg, repatha .   Has had prior LDL>200, c/f familial HLD  LDL now at goal <70    Lab Results   Component Value Date    LDL 29.0 09/08/2023     5. Severe COPD  cont'd smoking. Followed by pulmonology (Dr. Charlott)    Return to clinic: Return in about 2 months (around 05/09/2024).      Subjective:   Chase Mendoza, Chase Mendoza  Patient ID: [facility] Mendoza is an 58 y.o. male patient with CAD s/p PCI of RCA (04/2021), HTN, familial HLD, tobacco use, severe COPD, GERD who presents for follow-up.    History present illness:  Chase Mendoza is s/p PCI to RCA (03/2017) with 50% mLCX lesion which has been medically managed. Had progressive anginal sx and underwent PCI of ISR of mRCA stent at [facility] 04/14/21 (due to insurance). LCx dz similar to prior. He has had chronic stable angina - has not tolerated imdur  or ranexa .     Last seen in 09/2023 by myself. Had excellent reduction of LDL w/ Repatha . Now on ASA monotherapy (rash w/ plavix ).    History of Present Illness  Chase Mendoza is a 58 year old male with coronary artery disease who presents with worsening chest pain and dyspnea.    He has been experiencing significant chest pain, which worsened during the three days he took Ranexa , leading to its discontinuation. He uses nitroglycerin  approximately two and a half to three times a week, with the most recent use being this morning due to waking up with chest pain. The frequency of chest pain is variable, occurring four to five times a week or sometimes only once a week, often influenced by stress levels. The chest pain has worsened since his last visit and is similar to the symptoms he experienced before his last heart catheterization.    He reports worsening dyspnea over the past couple of months, which he describes as 'terrible'.  He recently saw a pulmonologist who performed a bone test and has scheduled an echocardiogram for next week. He experiences difficulty breathing to the extent that he cannot smoke as much and is trying to cut back. He experiences coughing with smoking and feels out of breath when eating, which has led to reduced food intake due to a sensation of choking.    He continues to smoke, albeit less than half a pack a day, as he is attempting to cut back due to breathing difficulties. He has not been able to engage in cardiac rehabilitation.    He is currently using his rescue inhaler regularly and has not required refills for his heart medications, as he plans to pick them up soon. His last heart catheterization was performed at [facility], and he has had a change in insurance since then.    He is on disability now.   Mother and brother both died of MI.     Objective:   Physical Exam:  BP 127/72 (BP Position: Sitting)  - Temp 36.2 ??C (97.1 ??F)  - Wt 97.7 kg (215 lb 6.4 oz)  - SpO2 98%  - BMI 28.64 kg/m??   General-  Normal appearing male in no apparent distress.  Neurologic- Alert and oriented X3. Cranial nerve II-XII grossly intact.  HEENT-  Normocephalic atraumatic head.  No scleral icterus.  MMM  Neck- Supple, no carotid bruis, no JVD  Lungs- Clear to auscultation, no wheezes, rhonchi, or rhales.  Prolonged expiratory phase.  Heart- RRR, nl S1S2, no MRG  Extremities-  No clubbing or cyanosis.  No LE edema  Pulses- +2 pulses in radial bilaterally.  Psych- Normal mood, appropriate.     Notable results:  Labs:   Lab Results   Component Value Date    WBC 10.0 09/15/2023    HGB 14.2 09/15/2023    HCT 42.1 09/15/2023    PLT 193 09/15/2023     Lab Results   Component Value Date    NA 143 09/15/2023    K 3.4 09/15/2023    CREATININE 0.87 09/15/2023      Lab Results   Component Value Date    CHOL 146 06/26/2021    LDL 29.0 09/08/2023    HDL 27 (L) 06/26/2021    TRIG 177 (H) 06/26/2021     Lab Results   Component Value Date    A1C 5.7 (H) 05/13/2019      Imaging/Other:  Electrocardiogram:    From 10/16/21 showed NSR.     03/11/23 - NSR    09/08/2023-NSR    03/09/24 - NSR w/ occas PVCs     Echo:  From 05/29/17 showed normal left ventricular systolic function, ejection fraction > 55%. Normal right ventricular systolic function. No significant valvular abnormalities.    From 08/25/21 showed LVEF 55%. The right ventricle is not well visualized but probably normal in size, with normal systolic function.     Nuclear Stress Test:  From 10/2017 was a normal myocardial perfusion study. No evidence for significant ischemia or scar noted. Post-stress EF > 65%.     From 05/26/19 showed normal myocardial perfusion study. No evidence for significant ischemia or scar is noted. Post stress: Global systolic function is normal. The ejection fraction calculated at 60%. Attenuation CT scan shows post PCI findings. CT finding show emphysematous and non-specific ground glass changes of the lungs.     Cardiac Catheterization:  From 03/2017 showed normal LV systolic function. Coronary artery disease including very long 50%  mid-RCA stenosis (FFR = 0.80) and 50% mid-circumflex stenosis. Successful PCI of mid-RCA with placement of an Onyx drug eluting stent.     From [facility] 04/14/21 showed 2 vessel coronary artery disease.   Successful PCI of mid RCA. LCx disease is similar to 2018   ISR of mid RCA was target for intervention. 2.25 x 12-mm PTCA balloon, HD IVUS used for optimization. Prior stents were expanded and apposed. Diffuse ISR. 3.0 x 28-mm Synergy DES at 16 atm. 3.0 x 20-mm [employer] Emerge used to post-dilate.        Calton JINNY Helena, AGNP-C  Cardiology Nurse Practitioner  [facility]

## 2024-03-09 NOTE — Unmapped (Signed)
 Pt I with dyspnea. Stopped ranexa  made chest pain worse

## 2024-03-10 MED ORDER — BREZTRI AEROSPHERE 160 MCG-9MCG-4.8MCG/ACTUATION HFA AEROSOL INHALER
Freq: Two times a day (BID) | RESPIRATORY_TRACT | 11 refills | 0.00000 days | Status: CP
Start: 2024-03-10 — End: 2024-04-09

## 2024-03-13 ENCOUNTER — Inpatient Hospital Stay: Admit: 2024-03-13 | Discharge: 2024-03-13 | Payer: Medicare (Managed Care)

## 2024-03-14 ENCOUNTER — Inpatient Hospital Stay: Admit: 2024-03-14 | Discharge: 2024-03-14 | Payer: Medicare (Managed Care)

## 2024-03-14 LAB — BASIC METABOLIC PANEL
ANION GAP: 10 mmol/L (ref 5–14)
BLOOD UREA NITROGEN: 6 mg/dL — ABNORMAL LOW (ref 9–23)
BUN / CREAT RATIO: 7
CALCIUM: 9.1 mg/dL (ref 8.7–10.4)
CHLORIDE: 104 mmol/L (ref 98–107)
CO2: 30 mmol/L (ref 20.0–31.0)
CREATININE: 0.83 mg/dL (ref 0.73–1.18)
EGFR CKD-EPI (2021) MALE: >90 mL/min/1.73m2 (ref >=60)
GLUCOSE RANDOM: 108 mg/dL — ABNORMAL HIGH (ref 70–99)
POTASSIUM: 3.3 mmol/L — ABNORMAL LOW (ref 3.4–4.8)
SODIUM: 144 mmol/L (ref 135–145)

## 2024-03-14 MED ORDER — PRASUGREL HCL 10 MG TABLET
ORAL_TABLET | Freq: Every day | ORAL | 3 refills | 90.00000 days | Status: CP
Start: 2024-03-14 — End: 2024-06-12

## 2024-03-14 MED ADMIN — fentaNYL (PF) (SUBLIMAZE) injection: INTRAVENOUS | @ 13:00:00 | Stop: 2024-03-14

## 2024-03-14 MED ADMIN — sodium chloride (NS) 0.9 % infusion: 50 mL/h | INTRAVENOUS | @ 13:00:00 | Stop: 2024-03-14

## 2024-03-14 MED ADMIN — prasugrel (EFFIENT) tablet: ORAL | @ 14:00:00 | Stop: 2024-03-14

## 2024-03-14 MED ADMIN — verapamil (ISOPTIN) injection: INTRA_ARTERIAL | @ 13:00:00 | Stop: 2024-03-14

## 2024-03-14 MED ADMIN — aspirin chewable tablet 243 mg: 243 mg | ORAL | @ 13:00:00 | Stop: 2024-03-14

## 2024-03-14 MED ADMIN — heparin (porcine) 1000 unit/mL injection: INTRAVENOUS | @ 13:00:00 | Stop: 2024-03-14

## 2024-03-14 MED ADMIN — fentaNYL (PF) (SUBLIMAZE) injection: INTRAVENOUS | @ 14:00:00 | Stop: 2024-03-14

## 2024-03-14 MED ADMIN — sodium chloride (NS) 0.9 % infusion: 50 mL/h | INTRAVENOUS | @ 14:00:00 | Stop: 2024-03-14

## 2024-03-14 MED ADMIN — lidocaine (PF) (XYLOCAINE-MPF) 20 mg/mL (2 %) injection: SUBCUTANEOUS | @ 13:00:00 | Stop: 2024-03-14

## 2024-03-14 MED ADMIN — nitroglycerin 12 mg/120 mL (100 mcg/mL) in heparinized saline: INTRACORONARY | @ 14:00:00 | Stop: 2024-03-14

## 2024-03-14 MED ADMIN — iohexol (OMNIPAQUE) 300 mg iodine/mL solution: INTRACORONARY | @ 14:00:00 | Stop: 2024-03-14

## 2024-03-14 MED ADMIN — verapamil (ISOPTIN) injection: INTRA_ARTERIAL | @ 14:00:00 | Stop: 2024-03-14

## 2024-03-14 MED ADMIN — midazolam (VERSED) injection: INTRAVENOUS | @ 14:00:00 | Stop: 2024-03-14

## 2024-03-14 MED ADMIN — heparin (porcine) 1000 unit/mL injection: INTRAVENOUS | @ 14:00:00 | Stop: 2024-03-14

## 2024-03-14 MED ADMIN — midazolam (VERSED) injection: INTRAVENOUS | @ 13:00:00 | Stop: 2024-03-14

## 2024-03-14 MED ADMIN — heparin (porcine) in NS Manifold Flush: @ 14:00:00 | Stop: 2024-03-14

## 2024-03-14 MED ADMIN — sodium chloride 0.9% (NS) bolus 300 mL: 300 mL | INTRAVENOUS | @ 13:00:00 | Stop: 2024-03-14

## 2024-03-14 NOTE — Unmapped (Signed)
 Cardiac Catheterization Laboratory  [place], [place]  Tel: [phone]     Fax: [phone]    HISTORY & PHYSICAL ASSESSMENT    PCP: [facility], [place]  Referring physicians: No referring provider defined for this encounter.   Primary cardiologist: Franky Cutting, MD, Calton Helena, AGNP    Procedures to be performed:   Coronary angiography  Left heart catheterization  Possible percutaneous coronary intervention    Indication: Chest pain, hx of CAD    HISTORY: This is a 58 y.o. male with CAD s/p 2x PCI of RCA (03/2017, 04/2021), HTN, HLD, tobacco use, COPD, GERD  who presents today for [facility] for angina.    History of CAD s/p PCI to RCA x2 (03/2017, 04/2021) with stable mid-LCx lesion on most recent [facility] 04/2021. Progressive unstable anginal symptoms using NTG more frequently than previous. He is also occasionally waking up with chest pain in the AM. Given symptoms of progressive angina, will pursue LHC for further evaluation of progressive CAD.     Brief labs:  Lab Results   Component Value Date    HGB 14.3 03/09/2024    HGB 14.4 05/04/2014    Platelet 266 03/09/2024    Platelet 279 05/04/2014    Creatinine 0.84 03/09/2024    Creatinine 0.85 05/04/2014    INR 1.12 04/25/2022    Troponin I <0.060 05/12/2019    hsTroponin I 3 09/15/2023       Cardiac risk factors:  hypertension, dyslipidemia, and smoking or vaping (current or former)    Relevant Cardiac History:  Hypertension  Hyperlipidemia  CAD    Assessments:  ECG: 03/09/24  SINUS RHYTHM WITH PREMATURE SUPRAVENTRICULAR BEATS  OTHERWISE NORMAL ECG  WHEN COMPARED WITH ECG OF 15-Sep-2023 17:47,  PREMATURE SUPRAVENTRICULAR BEATS ARE NOW PRESENT  NONSPECIFIC T WAVE ABNORMALITY NOW EVIDENT IN LATERAL LEADS       TTE: 11/05/23  Summary    1. The left ventricle is normal in size with normal wall thickness.    2. The left ventricular systolic function is normal, LVEF is visually estimated at > 55%.    3. The mitral valve leaflets are mildly thickened with normal leaflet mobility.    4. The aortic valve is trileaflet with mildly thickened leaflets with normal excursion.    5. The right ventricle is normal in size, with normal systolic function.    Stress: 12/25/2021  No wedge-shaped perfusion defect typical for pulmonary embolism is identified. Emphysema is present.     Last LHC: 04/14/2021  2 vessel coronary artery disease.   Successful PCI of mid RCA     LCx disease is similar to 2018   ISR of mid RCA was target for intervention   2.25 x 12-mm PTCA balloon, HD IVUS used for optimization   Prior stents were expanded and apposed   Diffuse ISR   3.0 x 28-mm Synergy DES at 16 atm   3.0 x 20-mm Maunie Emerge used to post-dilate     Pre-procedure medications administered: Aspirin      Medications contraindicated: N/A     OBJECTIVE  There were no vitals taken for this visit.    GENERAL: Comfortable appearing.  CV: Normal rate, regular rhythm.  PULM: Normal work of breathing.  EXT: Moves all extremities.  NEURO: Non-focal.    Labs and imaging were reviewed.     Bleeding risk:  Low. ACC CathPCI Bleeding Risk Calculator estimates bleeding risk at 0.6% (national average is 3.3%).     Strategies used  to mitigate risk include: Give preference to radial approach, micropuncture for femoral access, and limit heparin administration as able.    Consent: I hereby certify that the nature, purpose, benefits, usual and most frequent risks of, and alternatives to, the operation or procedure have been explained to the patient (or person authorized to sign for the patient) either by a physician or by the provider who is to perform the operation or procedure, that the patient has had an opportunity to ask questions, and that those questions have been answered. The patient or the patient's representative has been advised that selected tasks may be performed by assistants to the primary health care provider(s). I believe that the patient (or person authorized to sign for the patient) understands what has been explained, and has consented to the operation or procedure.

## 2024-03-14 NOTE — Unmapped (Signed)
 Patient returned from procedure. stable, alert & follows commands. Right radial band with 12ml of air, no active bleeding, no hematoma. Removal per protocol. Instructed patient to not use Right extremity for pushing/ pulling/lifting. Stretcher low, lock with rails up, call bell within reach.  Report received from [facility].

## 2024-03-14 NOTE — Unmapped (Signed)
 PRELIMINARY CARDIAC CATHETERIZATION REPORT  (Full report to follow within 72 hours)  ____________________________________________________________________________     Findings:  1-vessel coronary artery disease: 80% ISR in the distal RCA, mild to moderate disease elsewhere.  Elevated LVEDP = 20 mmHg.  Successful IVUS-guided PTCA of the distal RCA with an AngioSculpt scoring balloon and an Agent DCB. Excellent angiographic result with TIMI 3 flow.    Recommendations:  Aggressive secondary prevention.  DAPT with ASA 81 mg daily and prasugrel  10 mg daily for 6 months, followed by [facility] with prasugrel  10 mg daily indefinitely.  ____________________________________________________________________________                Date of cardiac procedure: 03/14/2024    Pre-op diagnosis: CAD with recurrent angina  Post-op diagnosis: same     Performing Service:  Cardiology  Surgeons and Role:     * Gil, Franky Barter, MD - Primary     * Lona Hinders, MD - Fellow - Diagnostic     * Ng, Jacques HERO, MD - Fellow - Interventional    Procedures performed:  Ultrasound-guided right radial artery access  Left heart catheterization  Coronary angiography  Intravascular ultrasound (IVUS)  Percutaneous transluminal coronary angioplasty (PTCA)    Arterial access site: right radial artery    Left heart catheterization:  Left ventricular end diastolic pressure (LVEDP) = 20 mmHg    PCI details: JR 4 guide catheter    Runthrough coronary guidewire    2.25 x 10 mm Sapphire Pro balloon    2.5 x 20 mm Sapphire Pro balloon    2.5 x 10 mm Angiosculpt Evo scoring balloon    IVUS    3.0 x 15 mm Sapphire NC24 balloon    3.0 x 20 mm Agent DCB  ____________________________________________________________________________                No specimens were taken.  Estimated blood loss: minimal.     I was present during the entire procedure.    Franky FELIX Gil, MD, [facility], [facility]  Assistant Professor of Medicine  Division of Cardiology  [facility]    03/14/2024 10:34 AM

## 2024-03-22 ENCOUNTER — Inpatient Hospital Stay: Admit: 2024-03-22 | Discharge: 2024-03-23 | Payer: Medicare (Managed Care)

## 2024-03-29 NOTE — Unmapped (Signed)
 Multidisciplinary Oncology Program Intake Form    Referral Receive Date:  03/02/24    Rapid Access Appointment : N/A    Reason for Referral:   Patient Diagnosis: J44.9 COPD  Initial Consultation: No treatment started for diagnosis  Disease Group: Thoracic   Specialty:  Medical Oncology    Referral Method:  Epic    Insurance  Primary Insurance: Private/ACA/Employer     Authorization Obtained: NA      Record Collection:     RECORDS -  INTERNAL REFERRAL, IMAGING -  INTERNAL REFERRAL, PATHOLOGY - No path needed    Screening Questions:     If <43, is the patient interested in learning about [facility] Preservation? NA    Close the Loop Communication:  Referring Provider Contacted: No  Patient Contacted: Yes    Welcome Packet   Date Sent: 03/29/2024  Sent: Email

## 2024-03-29 NOTE — Unmapped (Signed)
 [facility] Refill Coordination Note    Specialty Medication(s) to be Shipped:   CF/Pulmonary/Asthma: Dupixent     Other medication(s) to be shipped: No additional medications requested for fill at this time    Specialty Medications not needed at this time: Specialty Lite: Repatha      Chase Mendoza, DOB: [birth year]  Phone: [phone] (home)       All above HIPAA information was verified with patient.     Was a Nurse, learning disability used for this call? No    Completed refill call assessment today to schedule patient's medication shipment from the [facility]  [phone]).  All relevant notes have been reviewed.     Specialty medication(s) and dose(s) confirmed: Regimen is correct and unchanged.   Changes to medications: Chase Mendoza reports no changes at this time.  Changes to insurance: No  New side effects reported not previously addressed with a pharmacist or physician: None reported  Questions for the pharmacist: No    Confirmed patient received a Conservation officer, historic buildings and a Surveyor, mining with first shipment. The patient will receive a drug information handout for each medication shipped and additional FDA Medication Guides as required.       DISEASE/MEDICATION-SPECIFIC INFORMATION        For patients on injectable medications: Next injection is scheduled for 04/09/24.    SPECIALTY MEDICATION ADHERENCE     Medication Adherence    Specialty Medication: DUPIXENT  PEN 300 mg/2 mL pen injector (dupilumab )  Patient is on additional specialty medications: No              Were doses missed due to medication being on hold? No    DUPIXENT  PEN 300 mg/2 mL pen injector (dupilumab ): 0 doses of medicine on hand       REFERRAL TO PHARMACIST     Referral to the pharmacist: Not needed      [facility]     Shipping address confirmed in Epic.     Cost and Payment: Patient has a $0 copay, payment information is not required.    Delivery Scheduled: Yes, Expected medication delivery date: 04/05/24.     Medication will be delivered via UPS to the prescription address in Epic WAM.    Chase Mendoza   [facility]  Specialty Technician

## 2024-04-04 MED FILL — DUPIXENT 300 MG/2 ML SUBCUTANEOUS PEN INJECTOR: SUBCUTANEOUS | 28 days supply | Qty: 4 | Fill #9

## 2024-04-04 MED FILL — REPATHA SURECLICK 140 MG/ML SUBCUTANEOUS PEN INJECTOR: SUBCUTANEOUS | 28 days supply | Qty: 2 | Fill #4

## 2024-04-06 DIAGNOSIS — J410 Simple chronic bronchitis: Principal | ICD-10-CM

## 2024-04-06 DIAGNOSIS — T7840XD Allergy, unspecified, subsequent encounter: Principal | ICD-10-CM

## 2024-04-25 ENCOUNTER — Ambulatory Visit: Admit: 2024-04-25 | Payer: Medicare (Managed Care) | Attending: Pulmonary Disease | Primary: Pulmonary Disease

## 2024-04-25 ENCOUNTER — Inpatient Hospital Stay: Admit: 2024-04-25 | Payer: Medicare (Managed Care)

## 2024-04-25 NOTE — Progress Notes (Deleted)
 INTERVENTIONAL PULMONARY CLINIC NEW BLVR CONSULTATION    Assessment:     Patient:Chase Mendoza (02-Jul-1965)  Reason for visit: Mr.Zaccone is a 58 y.o.male who is being seen in consultation at the request of Dr. Charlott, Rosina Knudsen, MD for evaluation of endobronchial valve placement for treatment of COPD.    Based on preliminary testing, patient {Blank single:19197::does,does not} have hyperinflation and air trapping on pulmonary function testing.    Further testing {Blank single:19197::is,is not} not required:  {Blank multiple:19196::Post-bronchodilator spirometry,Lung volumes via body plethysmography,Six-minute walk test,Echocardiogram,High resolution CT chest,Arterial blood gas,Quantitative perfusion scan}    We prefer tests to be done at [facility] for efficient patient care however can send orders to local facility if more convenient for the patient.    Plan:     1. COPD with persistent dyspnea despite optimal medical management: Based on preliminary testing, patient {Blank single:19197::does,does not} have hyperinflation and air trapping on pulmonary function testing.    2. We have ordered the following tests for continued evaluation:  {Blank multiple:19196::Post-bronchodilator spirometry,Lung volumes via body plethysmography,Six-minute walk test,Echocardiogram,High resolution CT chest,Arterial blood gas,Quantitative perfusion scan}    3. Once all testing is complete, the patient will be discussed at our Bronchoscopic Lung Volume Reduction Candidacy meeting and will be contacted via telephone with recommendations for further testing or whether they meet criteria for the procedure.    Patient seen and evaluated with Dr. PIERRETTE      History:     Referring Physician:   Charlott Rosina Knudsen, MD  [address]  [facility] [address] Department of Medicine  [place],  [place] [address]    Reason for Visit: ***    HPI: ***    COPD diagnosis date:    Exercise capacity:    Supplemental oxygen:    Criteria for BLVR Procedure:   Data   Demographics/Hx    Age 3 to 14 years    Nonsmoker for at least 4 months    BMI < 35 kg/m^2    Clinically stable on Prednisone  <20 mg/day    <2 COPD exacerbations requiring hospitalization in the last 12 months    Completed pulmonary rehab (if feasible)    No antiplatelet or anticoagulation therapy that cannot be held for procedure    Lacks excess mucous production (< 1/2 cup per day)    Lacks history of invasive thoracic surgery, LVRS, or pleurodesis    On optimal medical therapy per GOLD guidelines    Pulmonary Function Testing    Post-BD FEV1/FVC < LLN    Post-BD FEV1 15-45% predicted    DLCO >/= 20% predicted    TLC >/= 100% predicted    RV >/= 150% predicted    between 100 and 500 m [phone] feet)    Additional Testing*    Echocardiogram         LVEF > 45% Y        RVSP < 45 mm Hg Y   ABG         PaO2 > 45 (regardless of O2      requirement         PCO2 < 55    * Performed within the last 6 months.     Quantitative CT analysis   [facility] ([facility]): Target lobe selection was based on a greater than 50% destruction score (percentage of voxels less than ?910 Hounsfield units on computed tomography [CT]) and heterogeneous emphysema defined as absolute difference of 15 or greater in destruction scores  between the targeted and ipsilateral lobes Nonie JINNY [facility]. 2018 Nov 1;198(9):1151-1164)     [facility]): Participants were required to have >=40% emphysema destruction in the target lobe (assessed at ?920 Hounsfield units) and a >=10% disease emphysema severity difference with the ipsilateral lobe.  Nonie JINNY [facility]. 2019 Dec 1;200(11):1354-1362)    MMRC at initial visit    Grade Description of Breathlessness Check one   0 I only get breathless with strenuous exercise    1 I get short of breath when hurrying on level ground or walking up a slight hill    2 On level ground, I walk slower than people of the same age because of breathlessness, or I have to stop for breath when walking at my own pace on the level    3 I stop for breath after walking about 100 yards or after a few minutes on level ground    4 I am too breathless to leave the house or I am breathless when dressing        Past Medical History[1]    Past Surgical History[2]    Current Medications[3]    Allergies as of 04/25/2024 - Reviewed 03/14/2024   Allergen Reaction Noted    Lopid [gemfibrozil] Other (See Comments) 07/05/2014    Buprenorphine-naloxone Nausea And Vomiting 05/07/2022    Metoprolol  Headache 04/01/2017    Statins-hmg-coa reductase inhibitors  10/30/2016    Lexapro [escitalopram oxalate] Other (See Comments) and Anxiety 07/04/2015       Family History[4]    Social History [5]    Exposures: None    Review of Systems  A 12 point review of systems was negative except for pertinent items noted in the HPI.    Objective:   Vitals:   There were no vitals filed for this visit.    Physical Examination:   General: Well developed, well nourished,  HEENT: EOMI, anicteric sclera, trachea midline  CV: Regular rate and rhythm,   Resp: Breathing non labored. Not tachypnic. Equal expansion   GI: Soft, non tender, non distended  Extremities: No bilateral lower extremity swelling  Neuro: Awake and alert. Follows commands      Labs and Imaging:     1. ***        [1]   Past Medical History:  Diagnosis Date    Blood in stool     Colon polyps 2016    adenomatous polyp    Constipation     COPD (chronic obstructive pulmonary disease) (CMS-HCC)     Diarrhea     Diverticulosis of colon     GERD (gastroesophageal reflux disease) 2016    esophagitis    Hyperlipidemia     Hypertension     Kidney stones     Migraines    [2]   Past Surgical History:  Procedure Laterality Date    PR CATH PLACE/CORON ANGIO, IMG SUPER/INTERP,W LEFT HEART VENTRICULOGRAPHY N/A 04/02/2017    Procedure: CATH LEFT HEART CATHETERIZATION W INTERVENTION;  Surgeon: Zachary Prentice Car, MD;  Location: [facility] CATH; Service: Cardiology    PR CATH PLACE/CORON ANGIO, IMG SUPER/INTERP,W LEFT HEART VENTRICULOGRAPHY N/A 03/14/2024    Procedure: Left Heart Catheterization;  Surgeon: Gil Franky Prentice, MD;  Location: [facility] CATH;  Service: Cardiology    PR COLONOSCOPY W/BIOPSY SINGLE/MULTIPLE N/A 07/05/2014    Procedure: COLONOSCOPY, FLEXIBLE, PROXIMAL TO SPLENIC FLEXURE; WITH BIOPSY, SINGLE OR MULTIPLE;  Surgeon: Eleanor CHRISTELLA Sorrel, MD;  Location: [facility] GI PROCEDURES  [facility];  Service: Gastroenterology    PR ESOPHAGEAL MOTILITY STUDY, MANOMETRY N/A 07/04/2015    Procedure: ESOPHAGEAL MOTILITY STUDY W/INT & REP;  Surgeon: Nurse-Based Giproc;  [facility];  Service: Gastroenterology    PR GERD TST W/ MUCOS IMPEDE ELECTROD,>1HR N/A 07/04/2015    Procedure: ESOPHAGEAL FUNCTION TEST, GASTROESOPHAGEAL REFLUX TEST W/ NASAL CATHETER INTRALUMINAL IMPEDANCE ELECTRODE(S) PLACEMENT, RECORDING, ANALYSIS AND INTERPRETATION; PROLONGED;  Surgeon: Nurse-Based Giproc;  Location: [facility];  Service: Gastroenterology    PR UP GI ENDOSCOPY,BALL DIL,30MM N/A 07/05/2014    Procedure: UGI ENDO; W/BALLOON DILAT ESOPHAGUS (<30MM DIAM);  Surgeon: Eleanor CHRISTELLA Sorrel, MD;  Location: [facility];  Service: Gastroenterology    PR UP GI ENDOSCOPY,BALL DIL,30MM N/A 02/01/2018    Procedure: UGI ENDO; W/BALLOON DILAT ESOPHAGUS (<30MM DIAM);  Surgeon: Lorrene Georgi Rase, MD;  [facility];  Service: Gastroenterology    PR UP GI ENDOSCOPY,DILATN W GUIDE N/A 11/16/2014    Procedure: UGI ENDOSCOPY; WITH INSERTION OF GUIDE WIRE FOLLOWED BY DILATION OF ESOPHAGUS OVER GUIDE WIRE;  Surgeon: Bernardino JONETTA Pence, MD;  Location: GI PROCEDURES [facility];  Service: Gastroenterology    PR UPPER GI ENDOSCOPY,BIOPSY N/A 07/05/2014    Procedure: UGI ENDOSCOPY; WITH BIOPSY, SINGLE OR MULTIPLE;  Surgeon: Eleanor CHRISTELLA Sorrel, MD;  Location: [facility] PROCED[facility];  Service: Gastroenterology    PR UPPER GI ENDOSCOPY,W/DIR SUBMUC INJ N/A 02/01/2018    Procedure: UGI ENDO, INCL ESOPH/STOMACH/& EITHER DUODENUM AND/OR JEJUNUM AS APPROP; WITH DIRECTED SUBMUCOSAL INJECTION;  Surgeon: Lorrene Georgi Rase, MD;  [facility]: [facility];  Service: Gastroenterology    SALIVARY GLAND SURGERY     [3]   Current Outpatient Medications   Medication Sig Dispense Refill    ADDERALL XR 30 mg 24 hr capsule Take 1 capsule (30 mg total) by mouth.      albuterol  HFA 90 mcg/actuation inhaler Inhale 2 puffs every six (6) hours as needed for wheezing. 8.5 g 2    amlodipine (NORVASC) 5 MG tablet       aspirin  (ECOTRIN) 81 MG tablet Take 1 tablet (81 mg total) by mouth daily.      atorvastatin  (LIPITOR) 80 MG tablet Take 1 tablet (80 mg total) by mouth daily. 90 tablet 3    azelastine (ASTELIN) 137 mcg (0.1 %) nasal spray       budesonide -glycopyr-formoterol  (BREZTRI  AEROSPHERE) 160-9-4.8 mcg/actuation inhaler Inhale 2 puffs two (2) times a day. 10.7 g 11    carvedilol  (COREG ) 12.5 MG tablet Take 1 tablet (12.5 mg total) by mouth two (2) times a day. 180 tablet 3    dextroamphetamine-amphetamine (ADDERALL) 20 mg tablet       dextromethorphan -guaiFENesin  5-50 mg/5 mL Liqd Take 5 mL by mouth every four (4) hours as needed (cought). 118 mL 0    dupilumab  (DUPIXENT  PEN) 300 mg/2 mL pen injector Inject the contents of 1 pen (300 mg) under the skin every fourteen (14) days. 4 mL 11    empty container (SHARPS-A-GATOR DISPOSAL SYSTEM) Misc Use as directed for sharps disposal 1 each 2    [Paused] EPINEPHrine  (EPIPEN ) 0.3 mg/0.3 mL injection Inject 0.3 mL (0.3 mg total) into the muscle once for 1 dose. 0.3 mL 1    evolocumab  (REPATHA  SURECLICK) 140 mg/mL PnIj Inject the contents of one pen (140 mg) under the skin every fourteen (14) days. 2 mL 11    ezetimibe  (ZETIA ) 10 mg tablet Take 1 tablet (10  mg total) by mouth daily. 90 tablet 3    fluticasone propionate (FLONASE) 50 mcg/actuation nasal spray       ipratropium-albuteroL  (DUO-NEB) 0.5-2.5 mg/3 mL nebulizer USE 1 VIAL VIA NEBULIZER EVERY 6 HOURS AS NEEDED. 360 mL 6    losartan (COZAAR) 50 MG tablet 1 tablet (50 mg total) in the morning.      nitroglycerin  (NITROSTAT ) 0.4 MG SL tablet Place 1 tablet (0.4 mg total) under the tongue every five (5) minutes as needed for chest pain. Maximum of 3 doses in 15 minutes. 25 each 0    omeprazole  (PRILOSEC) 20 MG capsule TAKE (1) CAPSULE BY MOUTH TWICE DAILY BEFORE MEALS 180 capsule 1    prasugrel  (EFFIENT ) 10 mg tablet Take 1 tablet (10 mg total) by mouth daily. 90 tablet 3    pregabalin (LYRICA) 50 MG capsule       QUEtiapine (SEROQUEL) 100 MG tablet Take 1 tablet (100 mg total) by mouth nightly. Takes full 100 mg as of Feb 2024       No current facility-administered medications for this visit.   [4]   Family History  Problem Relation Age of Onset    Colorectal Cancer Neg Hx     Crohn's disease Neg Hx     Ulcerative colitis Neg Hx    [5]   Social History  Socioeconomic History    Marital status: Married   Tobacco Use    Smoking status: Every Day     Current packs/day: 0.50     Average packs/day: 0.5 packs/day for 40.0 years (20.0 ttl pk-yrs)     Types: Cigarettes    Smokeless tobacco: Former     Types: Snuff     Quit date: 2021    Tobacco comments:     pt reports that he is down to 6 cigs/day- after meals   Vaping Use    Vaping status: Some Days    Substances: Nicotine    Substance and Sexual Activity    Alcohol use: No     Alcohol/week: 0.0 standard drinks of alcohol    Drug use: Not Currently     Frequency: 2.0 times per week     Comment: has decreased MJ use down to 3x/week- previously documented as 14    Sexual activity: Yes     Partners: Female   Social History Narrative    Was incarcerated 5 days just prior to admission. Denies any recent drug use other than marijuana, states that he remotely used cocaine and was previously incarcerated 6 years for drug charges.        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 (Reviewed 09/02/17) Social Drivers of Health     Tobacco Use: High Risk (03/14/2024)    Patient History     Smoking Tobacco Use: Every Day     Smokeless Tobacco Use: Former

## 2024-04-26 NOTE — Telephone Encounter (Signed)
 Copied from CRM #[id number]. Topic: New Scheduling - Other New Intake  >> Apr 26, 2024  8:11 AM Almeda NOVAK wrote:  Chase Mendoza, Chase Mendoza  April 02, 1966  [phone]    Patient missed his appts yesterday and needs to reschedule    Thank you,   Almeda GORMAN [address]vena  [facility]  [phone]

## 2024-05-01 NOTE — Progress Notes (Deleted)
 [facility] Delivery Pharm[facility] Refill Coordination Note    Chase Mendoza, DOB: 04-28-1966  Phone: [phone] (home)       All above HIPAA information was verified with patient.         03/08/2023     4:21 PM   Specialty Rx Medication Refill Questionnaire   Which Medications would you like refilled and shipped? Repatha    Please list all current allergies: Lopid   Have you missed any doses in the last 30 days? No   Have you had any changes to your medication(s) since your last refill? No   How much of each medication do you have remaining at home? (eg. number of tablets, injections, etc.) 0   If receiving an injectable medication, next injection date is 03/14/2023   Have you experienced any side effects in the last 30 days? No   Please enter the full address (street address, city, state, zip code) where you would like your medication(s) to be delivered to. [address] [place] [address]   Please specify on which day you would like your medication(s) to arrive. Note: if you need your medication(s) within 3 days, please call the pharmacy to schedule your order at [phone]  03/11/2023   Has your insurance changed since your last refill? No   Would you like a pharmacist to call you to discuss your medication(s)? No   Do you require a signature for your package? (Note: if we are billing Medicare Part B or your order contains a controlled substance, we will require a signature) No   Additional Comments: None         Completed refill call assessment today to schedule patient's medication shipment from the [facility] [phone]).  All relevant notes have been reviewed.       Confirmed patient received a Conservation Officer, Historic Buildings and a Surveyor, Mining with first shipment. The patient will receive a drug information handout for each medication shipped and additional FDA Medication Guides as required.         REFERRAL TO PHARMACIST     Referral to the pharmacist: Not needed      [facility]     Shipping address confirmed in Epic.     Delivery Scheduled: Yes, Expected medication delivery date: 11/26.     Medication will be delivered via UPS to the prescription address in Epic WAM.    Ladaisha Portillo   [place] [facility] Specialty Technician

## 2024-05-01 NOTE — Progress Notes (Signed)
 [facility] Delivery Pharm[facility] Refill Coordination Note    Specialty Medication(s) to be Shipped:   CF/Pulmonary/Asthma: Dupixent  and Specialty Lite: Repatha     Other medication(s) to be shipped: No additional medications requested for fill at this time    Specialty Medications not needed at this time: N/A     Chase Mendoza, DOB: 1966/03/06  Phone: [phone] (home)       All above HIPAA information was verified with patient.     Was a nurse, learning disability used for this call? No    Completed refill call assessment today to schedule patient's medication shipment from the [facility]  [phone]).  All relevant notes have been reviewed.     Specialty medication(s) and dose(s) confirmed: Regimen is correct and unchanged.   Changes to medications: Callan reports no changes at this time.  Changes to insurance: No  New side effects reported not previously addressed with a pharmacist or physician: None reported  Questions for the pharmacist: No    Confirmed patient received a Conservation Officer, Historic Buildings and a Surveyor, Mining with first shipment. The patient will receive a drug information handout for each medication shipped and additional FDA Medication Guides as required.       DISEASE/MEDICATION-SPECIFIC INFORMATION        For patients on injectable medications: Next injection is scheduled for 11/30.    SPECIALTY MEDICATION ADHERENCE     Medication Adherence    Patient reported X missed doses in the last month: 0  Specialty Medication: DUPIXENT  PEN 300 mg/2 mL pen injector (dupilumab )  Patient is on additional specialty medications: No              Were doses missed due to medication being on hold? No    DUPIXENT  PEN 300 mg/2 mL pen injector (dupilumab ): 0 doses of medicine on hand     REFERRAL TO PHARMACIST     Referral to the pharmacist: Not needed      [facility]     Shipping address confirmed in Epic.     Cost and Payment: Patient has a $0 copay, payment information is not required.    Delivery Scheduled: Yes, Expected medication delivery date: 11/26.     Medication will be delivered via UPS to the prescription address in Epic WAM.    Flavio Lindroth   [place] [facility]  Specialty Technician

## 2024-05-02 MED FILL — DUPIXENT 300 MG/2 ML SUBCUTANEOUS PEN INJECTOR: SUBCUTANEOUS | 28 days supply | Qty: 4 | Fill #10

## 2024-05-02 MED FILL — REPATHA SURECLICK 140 MG/ML SUBCUTANEOUS PEN INJECTOR: SUBCUTANEOUS | 28 days supply | Qty: 2 | Fill #5

## 2024-05-09 ENCOUNTER — Inpatient Hospital Stay: Admit: 2024-05-09 | Discharge: 2024-05-09 | Payer: Medicare (Managed Care)

## 2024-05-09 ENCOUNTER — Ambulatory Visit
Admit: 2024-05-09 | Discharge: 2024-05-09 | Payer: Medicare (Managed Care) | Attending: Pulmonary Disease | Primary: Pulmonary Disease

## 2024-05-09 LAB — BLOOD GAS, ARTERIAL
BASE EXCESS ARTERIAL: 1.2 (ref -2.0–2.0)
CARBOXYHEMOGLOBIN: 5.3 % — ABNORMAL HIGH (ref <1.2)
HCO3 ARTERIAL: 26 mmol/L (ref 22–27)
METHEMOGLOBIN: <1 % (ref <1.5)
O2 SATURATION ARTERIAL: 97.4 % (ref 94.0–100.0)
OXYHEMOGLOBIN: 91.8 % — ABNORMAL LOW (ref 94.0–100.0)
PCO2 ARTERIAL: 41 mmHg (ref 35.0–45.0)
PH ARTERIAL: 7.41 (ref 7.35–7.45)
PO2 ARTERIAL: 88.7 mmHg (ref 80.0–110.0)

## 2024-05-09 NOTE — Progress Notes (Deleted)
 Y/N Data   Demographics/Hx     Age 58 to 57 years     Nonsmoker for at least 4 months     BMI < 35 kg/m^2     Clinically stable on Prednisone  <20 mg/day     <2 COPD exacerbations requiring hospitalization in the last 12 months     Completed pulmonary rehab (if feasible)     No antiplatelet or anticoagulation therapy that cannot be held for procedure     Lacks excess mucous production (< 1/2 cup per day)     Lacks history of invasive thoracic surgery, LVRS, or pleurodesis     On optimal medical therapy per GOLD guidelines     Pulmonary Function Testing     Post-BD FEV1/FVC < LLN     Post-BD FEV1 15-45% predicted     DLCO >/= 20% predicted     TLC >/= 100% predicted     RV >/= 150% predicted     between 100 and 500 m ([phone] feet)     Additional Testing*     Echocardiogram          LVEF > 45%          RVSP < 45 mm Hg     ABG          PaO2 > 45 (regardless of O2      requirement          PCO2 < 55     * Performed within the last 6 months.   Echocardiogram and ABG not required for referral. If referring provider prefers, we can arrange for testing on the day of evaluation.

## 2024-05-09 NOTE — Progress Notes (Signed)
 INTERVENTIONAL PULMONARY CLINIC NEW PATIENT EVALUATION    Assessment:     Patient:Chase Mendoza (1965/08/05)  Reason for visit: Mr.Dohse is a 58 y.o.male who is being seen in consultation at the request of Dr. Rosina Sauers for bronchial valve candidacy evaluation.      This is a lovely 58 year old active smoker (~40 pk yrs, currently 1/2 ppd) with COPD (mixed phenotype), OSA who present for endobronchial valve placement. The apical distribution of emphysema is favorable for valves. His gas trapping is severe enough to meet criteria for valves, but he is still quite functional despite significant symptom burden (CAT 21). He does not qualify at present due to active smoking; however, if able to quit would certainly want to revisit candidacy for valves. He expresses interest in quitting and has quit before, so I am optimistic he can do so again. Okay with me placing referral to FM smoking cessation program today. Discussed pulm rehab; felt of questionable benefit when he last completed (virtual, [facility]). Encouraged consideration of in person rehab. Mucus hypersecretion is a relative CI; however, I am hopeful that would improve when no longer smoking. In the meantime, he might benefit from a trial of mechanical airway clearance given imaging with mucus plugs and pt reporting mucus in chest that he struggles to expectorate. Lastly, pt currently on ASA + DAPT therapy iso recent DES to RCA (03/14/24). After 6 mo plan to transition to [facility] indefinitely. Will touch base with cards to clarify when his antiplatelet could be safely held.    Bronchial Lung Valve Criteria Y/N Data   Demographics/Hx     Age 58 to 2 years Y    Nonsmoker for at least 4 months N Active smoker, down to 1/2 ppd   BMI < 35 kg/m^2 Y 28   Clinically stable on Prednisone  <20 mg/day Y Not on chronic steroids   <2 COPD exacerbations requiring hospitalization in the last 12 months Y ~3 exacerbations requiring steroids but never hospitalized   Completed pulmonary rehab (if feasible) N Complete online x1, >1 ya. Open to rehab today, declined at  59/2024 gen pulm visit   No antiplatelet or anticoagulation therapy that cannot be held for procedure N DES placement   Lacks excess mucous production (< 1/2 cup per day) Y Feels mucus in chest but does not expectorate much on day to day basis   Lacks history of invasive thoracic surgery, LVRS, or pleurodesis     On optimal medical therapy per GOLD guidelines     Pulmonary Function Testing     Post-BD FEV1/FVC < LLN     Post-BD FEV1 15-45% predicted     DLCO >/= 20% predicted     TLC >/= 100% predicted     RV >/= 150% predicted     between 100 and 500 m [phone] feet)     Additional Testing*     Echocardiogram          LVEF > 45%          RVSP < 45 mm Hg     ABG          PaO2 > 45 (regardless of O2      requirement          PCO2 < 55     * Performed within the last 6 months.        Plan:     COPD - Emphysema - Chronic Bronchitis  Tobacco Use Disorder  COPD with emphysema  and mucus hypersecretion exacerbated by smoking. Obstructive disease with gas trapping on PFTs. CT shows apical emphysema. Smoking precludes bronchial valve placement due to risk of valve occlusion from mucus.  - Encouraged smoking cessation for four months to qualify for bronchial valve placement.  - Recommended pulmonary rehabilitation to optimize lung function.  - Discussed mechanical airway clearance techniques such as OPEP  - Consider bronchial valve placement post-smoking cessation and pulmonary rehab.    History:     Referring Physician:   Charlott Rosina Knudsen, MD  [address]  [facility] [address]  [facility] Department of Medicine  [place],  [place] [address]    Reason for Visit: Bronchial Lung Valve     HPI:   Chase Mendoza is a 58 year old male smoker (~20 pk/yrs), Group 2b COPD, CAD sp PCI to RCA (2018) and PTCA of RCA (03/2024, on DAPT), and GERD who presents for evaluation of lung valve placement. He was referred by his primary pulmonologist, Dr. Rosina Charlott.    Mr. Coffelt has a long history of smoking, starting at age 35, and currently smokes about half a pack per day, reduced from a previous high of one and a half to two packs per day. Approximately 40 pk/yrs. He is attempting to cut back further due to difficulty breathing and excessive mucus production. Using about 1/2 pack per day at present.    He experiences a persistent cough and a sensation of his lungs being full of mucus, which he struggles to expel. Albuterol  nebulizer treatments are used but are ineffective in clearing the mucus. Has not trialed mechanical airway clearance. He has significant difficulty sleeping due to breathing issues and often resorts to smoking to induce coughing, which helps clear some mucus.    He has had three exacerbations requiring oral steroids in the past year, with the most recent episode when he contracted COVID-19. He has never been hospitalized for respiratory issues.     His respiratory symptoms significantly impact his quality of life, including difficulty sleeping. He has completed pulmonary rehab at least once but has never gone to an in person program.    Importantly, he has a history of coronary artery disease sp PCI to RCA in 2018 and more recently PTCA of RCA (10/25). DAPT x6 mo foll by SAPT (prasugrel )    Current Medications[1]    Allergies as of 05/09/2024 - Reviewed 05/09/2024   Allergen Reaction Noted    Lopid [gemfibrozil] Other (See Comments) 07/05/2014    Buprenorphine-naloxone Nausea And Vomiting 05/07/2022    Metoprolol  Headache 04/01/2017    Statins-hmg-coa reductase inhibitors  10/30/2016    Lexapro [escitalopram oxalate] Other (See Comments) and Anxiety 07/04/2015     Review of Systems  Pertinent items are noted in HPI.    Objective:     Physical Examination:   Vitals:   Vitals:    05/09/24 1533   BP: 130/97   Pulse: 77   Resp: 16   Temp: 36.4 ??C (97.5 ??F)   SpO2: 98%     Body mass index is 28.1 kg/m??.     General appearance - middle aged white male, appears conditioned, well kempt  Mental Status: AAOx3, thought organized   Lungs: Clear to ausculation, unlabored breathing   Heart: Regular rate and rhythm, normal S1 and S2, no murmur   Abdomen: Soft, non-tender, non-distended      Labs and Imaging:   LABS  05/09/24 ABG pH 7.41 - 41 - 89 - 26  IMAGING & PROCEDURES  05/09/24 Spirometry/FVL: Moderate obstructive defect (FEV1 40%)  05/09/24 DLCO: moderate diffusion impairment (z-score -2.97)  03/02/24 LVs via Plethysmography: Severe gas trapping (RV 268%, RV/TLC 202%)  05/09/24 : 372 m, dyspnea scale 3-5, no supplemental O2 needed    03/13/24 ncCT Chest  Impression  Moderate emphysema and bronchial wall thickening w/ mucus plugs present.    03/14/24 LHC  - 1-vessel coronary artery disease: 80% ISR in the distal RCA, mild to moderate disease elsewhere.  - Elevated LVEDP = 20  - Successful IVUS-guided PTCA of the distal RCA with an AngioSculpt scoring balloon and an Agent DCB. Excellent angiographic result with TIMI 3 flow.  Recommendations:  - Aggressive secondary prevention  - DAPT with ASA 81 mg daily and prasugrel  10 mg daily for 6 months, followed by [facility] with prasugrel  10 mg daily indefinitely.     03/22/24 TTE  Summary    1. The left ventricle is normal in size with normal wall thickness.    2. The left ventricular systolic function is normal with no obvious regional  wall motion abnormalities, LVEF is visually estimated at 55-60%.    3. The right ventricle is normal in size, with normal systolic function.    4. Aortic valve sclerosis.    5. Technically difficult study due to chest wall/lung interference. Would  recommend use of ultrasound enhancing agent (contrast) for future wall motion    12/2021 V/Q/Spect: normal perfusion, changes c/w COPD and emphysema         [1]   Current Outpatient Medications   Medication Sig Dispense Refill    ADDERALL XR 30 mg 24 hr capsule Take 1 capsule (30 mg total) by mouth.      albuterol  HFA 90 mcg/actuation inhaler Inhale 2 puffs every six (6) hours as needed for wheezing. 8.5 g 2    amlodipine (NORVASC) 5 MG tablet Take 1 tablet (5 mg total) by mouth daily.      aspirin  (ECOTRIN) 81 MG tablet Take 1 tablet (81 mg total) by mouth daily.      atorvastatin  (LIPITOR) 80 MG tablet Take 1 tablet (80 mg total) by mouth daily. 90 tablet 3    azelastine (ASTELIN) 137 mcg (0.1 %) nasal spray       budesonide -glycopyr-formoterol  (BREZTRI  AEROSPHERE) 160-9-4.8 mcg/actuation inhaler Inhale 2 puffs two (2) times a day. 10.7 g 11    dextroamphetamine-amphetamine (ADDERALL) 20 mg tablet       dextromethorphan -guaiFENesin  5-50 mg/5 mL Liqd Take 5 mL by mouth every four (4) hours as needed (cought). 118 mL 0    dupilumab  (DUPIXENT  PEN) 300 mg/2 mL pen injector Inject the contents of 1 pen (300 mg) under the skin every fourteen (14) days. 4 mL 11    empty container (SHARPS-A-GATOR DISPOSAL SYSTEM) Misc Use as directed for sharps disposal 1 each 2    evolocumab  (REPATHA  SURECLICK) 140 mg/mL PnIj Inject the contents of one pen (140 mg) under the skin every fourteen (14) days. 2 mL 11    ezetimibe  (ZETIA ) 10 mg tablet Take 1 tablet (10 mg total) by mouth daily. 90 tablet 3    fluticasone propionate (FLONASE) 50 mcg/actuation nasal spray       ipratropium-albuteroL  (DUO-NEB) 0.5-2.5 mg/3 mL nebulizer USE 1 VIAL VIA NEBULIZER EVERY 6 HOURS AS NEEDED. 360 mL 6    losartan (COZAAR) 50 MG tablet 1 tablet (50 mg total) in the morning.      nitroglycerin  (NITROSTAT ) 0.4 MG SL tablet Place 1  tablet (0.4 mg total) under the tongue every five (5) minutes as needed for chest pain. Maximum of 3 doses in 15 minutes. 25 each 0    omeprazole  (PRILOSEC) 20 MG capsule TAKE (1) CAPSULE BY MOUTH TWICE DAILY BEFORE MEALS 180 capsule 1    prasugrel  (EFFIENT ) 10 mg tablet Take 1 tablet (10 mg total) by mouth daily. 90 tablet 3    pregabalin (LYRICA) 50 MG capsule       QUEtiapine (SEROQUEL) 100 MG tablet Take 1 tablet (100 mg total) by mouth nightly. Takes full 100 mg as of Feb 2024      atenolol  (TENORMIN ) 50 MG tablet Take 1 tablet (50 mg total) by mouth daily. 100 tablet 3     No current facility-administered medications for this visit.

## 2024-05-09 NOTE — Procedures (Signed)
 Performed ABG on L radial artery without complication.  No adverse effects.  Sent to lab.  See flowsheet for details.

## 2024-05-11 DIAGNOSIS — Z720 Tobacco use: Principal | ICD-10-CM

## 2024-05-11 DIAGNOSIS — E78019 Familial hypercholesterolemia, unspecified type: Principal | ICD-10-CM

## 2024-05-11 DIAGNOSIS — E782 Mixed hyperlipidemia: Principal | ICD-10-CM

## 2024-05-11 DIAGNOSIS — I25118 Atherosclerotic heart disease of native coronary artery with other forms of angina pectoris: Principal | ICD-10-CM

## 2024-05-11 DIAGNOSIS — I10 Essential (primary) hypertension: Principal | ICD-10-CM

## 2024-05-11 MED ORDER — ATENOLOL 50 MG TABLET
ORAL_TABLET | Freq: Every day | ORAL | 3 refills | 100.00000 days | Status: CP
Start: 2024-05-11 — End: 2025-06-15

## 2024-05-11 NOTE — Progress Notes (Signed)
 DIVISION OF CARDIOLOGY   [facility]                                                                         Date of Service:  05/11/2024     Assessment/Plan:     1. CAD w/ chronic stable angina  s/p PCI to RCA (03/2017) with 50% mLCX lesion which has been medically managed. S/p PCI of ISR of mRCA stent at [facility] 04/14/21 & balloon angioplasty of ISR of dRCA 03/14/24. Echo w/ normal LV & RV function.   Did not tolerate imdur  (HA) or ranexa .   Angina improved since recent PTCA, using NTG a few times a week now, no rest pain.   - continue ASA, prasugrel  (rash with plavix )>>will switch to prasugrel  monotherapy after 6 months  - anti-anginal: carvedilol  12.5 mg BID, amlodipine 5 mg >>> switch coreg  to atenolol  50 mg daily for cardioselective effect, goal resting HR 50s-60s (currently 70-80s) - had HA w/ toprol   - lipid mgmt as per below  - has NTG  - he declines cardiac rehab    2. Essential hypertension  Well controlled     3. Tobacco abuse disorder  <1/2 ppd. Has tried chantix  and wellbutrin  in the past. I again counseled him on the importance of quitting smoking but he remains precontemplative.       4. Familial hyperlipidemia   Continue atorvastatin  80, Zetia  10 mg, repatha .   Has had prior LDL>200, c/f familial HLD  LDL now at goal <70    Lab Results   Component Value Date    LDL 29.0 09/08/2023     5. Severe COPD  cont'd smoking. Followed by pulmonology (Dr. Charlott). Recent PFTs - FEV1 40%    Return to clinic: Return in about 3 months (around 08/09/2024).      Subjective:   ERE:[facility] [place]  Patient ID: BURNELL HURTA is an 58 y.o. male patient with CAD s/p PCI of RCA (04/2021), HTN, familial HLD, tobacco use, severe COPD, GERD who presents for follow-up.    History present illness:  Mr. Glasscock is s/p PCI to RCA (03/2017) with 50% mLCX lesion which has been medically managed. Had progressive anginal sx and underwent PCI of ISR of mRCA stent at [facility] 04/14/21 (due to insurance). LCx dz similar to prior. He has had chronic stable angina - has not tolerated imdur  or ranexa .     Last seen in 03/2024 by myself for worsening chest pain w/ unstable features and dyspnea. He did not tolerate Ranexa . Arranged for repeat LHC with PTCA of distal 80% ISR of dRCA. LVEDP was 20.   History of Present Illness  JAEVEN WANZER is a 58 year old male with coronary artery disease who presents for f/up after heart cath.    He has ongoing chest pain that has persisted since his procedure and stent placement. The pain occurs less frequently than before but is still present, especially during exertion or stress. It has not woken him up like before. He uses NTG less than four times a week for pain management.    He has a significant smoking history and currently smokes less than half a pack a day. Previous  attempts to quit using Chantix  and Wellbutrin  were unsuccessful.    His lung function test shows an FEV1 of 40% predicted. He reports that his breathing worsens after undergoing breathing tests, which he feels sets him back in his recovery. He has switched back to Trelegy for his respiratory condition, which he finds less effective.    He is on disability now.   Mother and brother both died of MI.     Objective:   Physical Exam:  BP 129/70  - Pulse 88  - Temp 36.2 ??C (97.1 ??F)  - Resp 20  - Ht 185.4 cm (6' 1)  - Wt 96.2 kg (212 lb 1.6 oz)  - SpO2 99%  - BMI 27.98 kg/m??   General-  Chronically ill appearing male in no apparent distress.  Neurologic- Alert and oriented X3.  Cranial nerve II-XII grossly intact.  HEENT-  Normocephalic atraumatic head.  No scleral icterus.  MMM  Neck- Supple, no carotid bruis, no JVD  Lungs- Clear to auscultation, no wheezes, rhonchi, or rhales.  Prolonged expiratory phase.  Heart- RRR, distant heart tones, nl S1S2, no MRG  Extremities-  No clubbing or cyanosis.  No LE edema  Pulses- +2 pulses in radial bilaterally.  Psych- Normal mood, appropriate.     Notable results:  Labs:   Lab Results   Component Value Date    WBC 12.2 (H) 03/09/2024    HGB 14.3 03/09/2024    HCT 41.6 03/09/2024    PLT 266 03/09/2024     Lab Results   Component Value Date    NA 144 03/14/2024    K 3.3 (L) 03/14/2024    CREATININE 0.83 03/14/2024      Lab Results   Component Value Date    CHOL 146 06/26/2021    LDL 29.0 09/08/2023    HDL 27 (L) 06/26/2021    TRIG 177 (H) 06/26/2021     Lab Results   Component Value Date    A1C 5.7 (H) 05/13/2019      Imaging/Other:  Electrocardiogram:    From 10/16/21 showed NSR.     03/11/23 - NSR    09/08/2023-NSR    03/09/24 - NSR w/ occas PVCs     Echo:  From 05/29/17 showed normal left ventricular systolic function, ejection fraction > 55%. Normal right ventricular systolic function. No significant valvular abnormalities.    From 08/25/21 showed LVEF 55%. The right ventricle is not well visualized but probably normal in size, with normal systolic function.    TTE 03/22/24  1. The left ventricle is normal in size with normal wall thickness.    2. The left ventricular systolic function is normal with no obvious regional  wall motion abnormalities, LVEF is visually estimated at 55-60%.    3. The right ventricle is normal in size, with normal systolic function.    4. Aortic valve sclerosis.    5. Technically difficult study due to chest wall/lung interference. Would  recommend use of ultrasound enhancing agent (contrast) for future wall motion  assessment.     Nuclear Stress Test:  From 10/2017 was a normal myocardial perfusion study. No evidence for significant ischemia or scar noted. Post-stress EF > 65%.     From 05/26/19 showed normal myocardial perfusion study. No evidence for significant ischemia or scar is noted. Post stress: Global systolic function is normal. The ejection fraction calculated at 60%. Attenuation CT scan shows post PCI findings. CT finding show emphysematous and non-specific ground glass changes of  the lungs.     Cardiac Catheterization:  From 03/2017 showed normal LV systolic function. Coronary artery disease including very long 50% mid-RCA stenosis (FFR = 0.80) and 50% mid-circumflex stenosis. Successful PCI of mid-RCA with placement of an Onyx drug eluting stent.     From [facility] 04/14/21 showed 2 vessel coronary artery disease.   Successful PCI of mid RCA. LCx disease is similar to 2018   ISR of mid RCA was target for intervention. 2.25 x 12-mm PTCA balloon, HD IVUS used for optimization. Prior stents were expanded and apposed. Diffuse ISR. 3.0 x 28-mm Synergy DES at 16 atm. 3.0 x 20-mm  Emerge used to post-dilate.      LHC 03/14/24  - 1-vessel coronary artery disease: 80% ISR in the distal RCA, mild to moderate disease elsewhere.  - Elevated LVEDP = 20  - Successful IVUS-guided PTCA of the distal RCA with an AngioSculpt scoring balloon and an Agent DCB. Excellent angiographic result with TIMI 3 flow.     Calton JINNY Helena, AGNP-C  Cardiology Nurse Practitioner  [facility]

## 2024-05-11 NOTE — Patient Instructions (Addendum)
 Stop carvedilol     Start atenolol  50 mg daily. Goal resting HR 50s-60s to help improve angina    No other med changes    Need to quit smoking    Let me know if you change your mind about cardiac rehab    VISIT SUMMARY:  You visited us  today to discuss your ongoing chest pain, COPD, and smoking habits. We have made some adjustments to your medications and provided recommendations to help manage your conditions.    YOUR PLAN:  CORONARY ARTERY DISEASE WITH ANGINA: You have ongoing chest pain that is less frequent but still present, especially during exertion or stress.  -Continue using nitroglycerin  as needed for chest pain.  -We have switched your medication to atenolol  50 mg once daily to help control your heart rate and reduce chest pain.  -Please monitor your resting heart rate at home and report back in a couple of weeks.    CHRONIC OBSTRUCTIVE PULMONARY DISEASE (COPD): You have severe COPD with an FEV1 at 40% predicted. You feel that Trelegy is less effective for you.  -Continue using Trelegy for your respiratory condition.    ESSENTIAL HYPERTENSION: Your blood pressure is well-controlled with your current medications.  -Continue your current blood pressure medications: carvedilol  twice daily, amlodipine once daily, and losartan once daily.    TOBACCO USE DISORDER: You continue to smoke less than half a pack per day and have had difficulty quitting in the past.  -We strongly encourage you to quit smoking. Please let us  know if you need additional support or resources to help you quit.

## 2024-05-11 NOTE — Progress Notes (Signed)
 Balloon angioplasty 03/09/2024.  He is still taking Ntg sl for chest pain, pain is usually relieved with one Ntg sl sometimes two.

## 2024-05-22 DIAGNOSIS — J42 Unspecified chronic bronchitis: Principal | ICD-10-CM

## 2024-05-24 NOTE — Progress Notes (Signed)
 Pulmonary Clinic - Return Visit    Referring Physician :  Aleck Gerome Rummer  PCP:     [facility], [place].  Almarie Andes  Reason for Consult:   COPD    HISTORY:     History of Present Illness:  Chase Mendoza is a 58 y.o. male with a history of COPD (GOLD IIB), CAD s/p PCI to RCA in 2018, HTN, HLD, GERD, renal stones, persistent tobacco use (cigarettes)  whom we are seeing in consultation requested by Aleck Gerome Rummer for evaluation of COPD management and worsening shortness of breath.    Pertinent History/Chart Review   Chase Mendoza was 1st seen in March 2023 by our pulmonary clinic. He presented with SOB with minimal activity, productive cough (white/clear), dizzy, wakes up at night bc can't breathe. His symptoms improved some with duo neb, more with Trelegy. He is a current smoker (20 pk/yr) who is trying to quit. Medication selection and treatment options limited by finances.   S/p balloon angioplasty Oct 2025  Got chest pain with patches, can't tolerate Chantix  or wellbutrin  as it made is depression worse.  Can't do the gum because of no teeth.  Con't to be a frequent exacerbator.    Exacerbations:  Nov 2023, approx sept 2024, April 2025, Dec 2025      05/25/24:  S/p PCA distal RCA 03/14/24  Saw IP and good candidate for BLVR but still smoking--if able to abstain for 4 months would consider    History of Present Illness    He experienced a cold starting around Saturday or Sunday, for which he visited his primary care doctor on Monday and was prescribed antibiotics and steroids. He feels the steroids are breaking up mucus. He has not required steroids or antibiotics since his last visit in September until this week.    He uses Trelegy daily, which he finds helpful, and has stopped using Breztri  due to heart fluttering at night. He rinses his mouth after using Trelegy but experiences dry mouth, especially at night. DuoNeb is used two to three times a day as needed. He denies any swelling in his feet or changes in sleep patterns due to prednisone . He reports dry mouth and urinary issues but not worsened by prednisone . He has a history of coughing up mucus, which has become thinner and clearer with treatment.    He has a history of smoking since age 2, currently smoking about one and a half packs per day. He wants to quit smoking, especially after the holidays, but finds it challenging due to stress. He has tried vaping but finds it uncomfortable. His wife also smokes, which complicates his efforts to quit.    He takes Seroquel at night for stress but experiences dry mouth as a side effect. He also takes Adderall, which helps him focus. He has tried various treatments for dry mouth without success.    He had a heart procedure involving balloon dilation. He had COVID in April and was treated with steroids and antibiotics at that time.    He is currently taking doxycycline, which he tolerates well as long as he eats beforehand to avoid nausea. He is also on prednisone , which increases his appetite but does not cause fluid retention or affect his sleep.    COPD Assessment Test  Cough: 4 (05/25/24 1405)  Phlegm: 4 (05/25/24 1405)  Chest Tightness: 3 (05/25/24 1405)  Dyspnea: 4 (05/25/24 1405)  Activity Level: 4 (05/25/24 1405)  Confidence: 2 (05/25/24 1405)  Sleep:  4 (05/25/24 1405)  Energy: 5 (05/25/24 1405)  CAT Total: 30 (05/25/24 1405)  Most recent [facility] dyspnea scale: Not Found Last [facility] date: Not Found    ____________________________________________________    COPD Care Checklist  Alpha-1 Antitrypsin Testing: MM 2023    Lung Cancer Screening: LDCT 10/28/2023, due in 1 year    Smoking Status:  still smoking. Half a pack a day.  Got chest pain with patches, can't tolerate Chantix  or wellbutrin  as it made is depression worse.  Can't do the gum because of no teeth.    Vaccinations checked: yes    Pulmonary Rehab referral:  Didn't like pulm rehab--hurt his back.  Felt like it didn't help. Declines one today    Last blood gas: No results found for: BGASARTTC    He usually takes seroquel to help him sleep as without it he won't go to sleep.        COPD Assessment Test  Cough: 4 (05/25/24 1405)  Phlegm: 4 (05/25/24 1405)  Chest Tightness: 3 (05/25/24 1405)  Dyspnea: 4 (05/25/24 1405)  Activity Level: 4 (05/25/24 1405)  Confidence: 2 (05/25/24 1405)  Sleep: 4 (05/25/24 1405)  Energy: 5 (05/25/24 1405)  CAT Total: 30 (05/25/24 1405)  Most recent [facility]MR[facility] dyspnea scale: Not Found Last [facility] date: Not Found    Date CAT [facility]   March 29, 2019  1    August 28, 2021  19     December 18, 2021 21    Oct 21, 2022 28    April 29, 2023 28    10/28/23 30    03/02/2024 22 (missing one) 4   05/25/24 30          Past Medical History:  Past Medical History:   Diagnosis Date    Blood in stool     Colon polyps 2016    adenomatous polyp    Constipation     COPD (chronic obstructive pulmonary disease) (CMS-HCC)     Diarrhea     Diverticulosis of colon     GERD (gastroesophageal reflux disease) 2016    esophagitis    Hyperlipidemia     Hypertension     Kidney stones     Migraines      Sleep study march 2023: (Titration study):  needs CPAP at 12, medium mask.       Past Surgical History:   Procedure Laterality Date    PR CATH PLACE/CORON ANGIO, IMG SUPER/INTERP,W LEFT HEART VENTRICULOGRAPHY N/A 04/02/2017    Procedure: CATH LEFT HEART CATHETERIZATION W INTERVENTION;  Surgeon: Zachary Prentice Car, MD;  Location: [facility] CATH;  Service: Cardiology    PR CATH PLACE/CORON ANGIO, IMG SUPER/INTERP,W LEFT HEART VENTRICULOGRAPHY N/A 03/14/2024    Procedure: Left Heart Catheterization;  Surgeon: Gil Franky Prentice, MD;  Location: [facility] CATH;  Service: Cardiology    PR COLONOSCOPY W/BIOPSY SINGLE/MULTIPLE N/A 07/05/2014    Procedure: COLONOSCOPY, FLEXIBLE, PROXIMAL TO SPLENIC FLEXURE; WITH BIOPSY, SINGLE OR MULTIPLE;  Surgeon: Eleanor CHRISTELLA Sorrel, MD;  Location: [facility] GI PROCEDURES [facility];  Service: Gastroenterology    PR ESOPHAGEAL MOTILITY STUDY, MANOMETRY N/A 07/04/2015    Procedure: ESOPHAGEAL MOTILITY STUDY W/INT & REP;  Surgeon: Nurse-Based Giproc;  [facility]: [facility];  Service: Gastroenterology    PR GERD TST W/ MUCOS IMPEDE ELECTROD,>1HR N/A 07/04/2015    Procedure: ESOPHAGEAL FUNCTION TEST, GASTROESOPHAGEAL REFLUX TEST W/ NASAL CATHETER INTRALUMINAL IMPEDANCE ELECTRODE(S) PLACEMENT, RECORDING, ANALYSIS AND INTERPRETATION; PROLONGED;  Surgeon: Nurse-Based Giproc;  Location: [facility];  Service: Gastroenterology    PR UP GI ENDOSCOPY,BALL DIL,30MM N/A 07/05/2014    Procedure: UGI ENDO; W/BALLOON DILAT ESOPHAGUS (<30MM DIAM);  Surgeon: Eleanor CHRISTELLA Sorrel, MD;  Location: [facility];  Service: Gastroenterology    PR UP GI ENDOSCOPY,BALL DIL,30MM N/A 02/01/2018    Procedure: UGI ENDO; W/BALLOON DILAT ESOPHAGUS (<30MM DIAM);  Surgeon: Lorrene Georgi Rase, MD;  [facility]: [facility];  Service: Gastroenterology    PR UP GI ENDOSCOPY,DILATN W GUIDE N/A 11/16/2014    Procedure: UGI ENDOSCOPY; WITH INSERTION OF GUIDE WIRE FOLLOWED BY DILATION OF ESOPHAGUS OVER GUIDE WIRE;  Surgeon: Bernardino JONETTA Pence, MD;  Location: GI PROCEDURES [place] [facility];  Service: Gastroenterology    PR UPPER GI ENDOSCOPY,BIOPSY N/A 07/05/2014    Procedure: UGI ENDOSCOPY; WITH BIOPSY, SINGLE OR MULTIPLE;  Surgeon: Eleanor CHRISTELLA Sorrel, MD;  Location: [facility] PROCED[facility];  Service: Gastroenterology    PR UPPER GI ENDOSCOPY,W/DIR SUBMUC INJ N/A 02/01/2018    Procedure: UGI ENDO, INCL ESOPH/STOMACH/& EITHER DUODENUM AND/OR JEJUNUM AS APPROP; WITH DIRECTED SUBMUCOSAL INJECTION;  Surgeon: Lorrene Georgi Rase, MD;  [facility]: [facility];  Service: Gastroenterology    SALIVARY GLAND SURGERY         Other History:  The social history and family history were personally reviewed and updated in the patient's electronic medical record.    Family History   Problem Relation Age of Onset    Colorectal Cancer Neg Hx     Crohn's disease Neg Hx     Ulcerative colitis Neg Hx      Social History     Socioeconomic History    Marital status: Married   Tobacco Use    Smoking status: Every Day     Current packs/day: 0.50     Average packs/day: 0.5 packs/day for 40.0 years (20.0 ttl pk-yrs)     Types: Cigarettes    Smokeless tobacco: Former     Types: Snuff     Quit date: 2021    Tobacco comments:     pt reports that he is down to 6 cigs/day- after meals   Vaping Use    Vaping status: Some Days    Substances: Nicotine    Substance and Sexual Activity    Alcohol use: No     Alcohol/week: 0.0 standard drinks of alcohol    Drug use: Not Currently     Frequency: 2.0 times per week     Comment: has decreased MJ use down to 3x/week- previously documented as 14    Sexual activity: Yes     Partners: Female   Social History Narrative    Was incarcerated 5 days just prior to admission. Denies any recent drug use other than marijuana, states that he remotely used cocaine and was previously incarcerated 6 years for drug charges.        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 (Reviewed 09/02/17)     Social Drivers of Health     Tobacco Use: High Risk (05/25/2024)    Patient History     Smoking Tobacco Use: Every Day     Smokeless Tobacco Use: Former       Home Medications:  Current Outpatient Medications on File Prior to Visit   Medication Sig Dispense Refill    albuterol  HFA 90 mcg/actuation inhaler Inhale 2 puffs every six (6) hours as needed for wheezing. 8.5 g 2    ipratropium-albuteroL  (DUO-NEB) 0.5-2.5 mg/3  mL nebulizer USE 1 VIAL VIA NEBULIZER EVERY 6 HOURS AS NEEDED. 360 mL 6    ADDERALL XR 30 mg 24 hr capsule Take 1 capsule (30 mg total) by mouth.      amlodipine (NORVASC) 5 MG tablet Take 1 tablet (5 mg total) by mouth daily.      aspirin  (ECOTRIN) 81 MG tablet Take 1 tablet (81 mg total) by mouth daily.      atenolol  (TENORMIN ) 50 MG tablet Take 1 tablet (50 mg total) by mouth daily. 100 tablet 3    atorvastatin  (LIPITOR) 80 MG tablet Take 1 tablet (80 mg total) by mouth daily. 90 tablet 3    azelastine (ASTELIN) 137 mcg (0.1 %) nasal spray       dextroamphetamine-amphetamine (ADDERALL) 20 mg tablet       dextromethorphan -guaiFENesin  5-50 mg/5 mL Liqd Take 5 mL by mouth every four (4) hours as needed (cought). 118 mL 0    dupilumab  (DUPIXENT  PEN) 300 mg/2 mL pen injector Inject the contents of 1 pen (300 mg) under the skin every fourteen (14) days. 4 mL 11    empty container (SHARPS-A-GATOR DISPOSAL SYSTEM) Misc Use as directed for sharps disposal 1 each 2    evolocumab  (REPATHA  SURECLICK) 140 mg/mL PnIj Inject the contents of one pen (140 mg) under the skin every fourteen (14) days. 2 mL 11    ezetimibe  (ZETIA ) 10 mg tablet Take 1 tablet (10 mg total) by mouth daily. 90 tablet 3    fluticasone propionate (FLONASE) 50 mcg/actuation nasal spray       losartan (COZAAR) 50 MG tablet 1 tablet (50 mg total) in the morning.      nitroglycerin  (NITROSTAT ) 0.4 MG SL tablet Place 1 tablet (0.4 mg total) under the tongue every five (5) minutes as needed for chest pain. Maximum of 3 doses in 15 minutes. 25 each 0    omeprazole  (PRILOSEC) 20 MG capsule TAKE (1) CAPSULE BY MOUTH TWICE DAILY BEFORE MEALS 180 capsule 1    prasugrel  (EFFIENT ) 10 mg tablet Take 1 tablet (10 mg total) by mouth daily. 90 tablet 3    pregabalin (LYRICA) 50 MG capsule       QUEtiapine (SEROQUEL) 100 MG tablet Take 1 tablet (100 mg total) by mouth nightly. Takes full 100 mg as of Feb 2024       No current facility-administered medications on file prior to visit.       Allergies:  Allergies as of 05/25/2024 - Reviewed 05/25/2024   Allergen Reaction Noted    Lopid [gemfibrozil] Other (See Comments) 07/05/2014    Buprenorphine-naloxone Nausea And Vomiting 05/07/2022    Metoprolol  Headache 04/01/2017    Statins-hmg-coa reductase inhibitors  10/30/2016    Lexapro [escitalopram oxalate] Other (See Comments) and Anxiety 07/04/2015       Review of Systems:  A comprehensive review of systems was completed and negative except as noted in HPI.    PHYSICAL EXAM:   BP 114/73 (BP Site: L Arm, BP Position: Sitting, BP Cuff Size: Large)  - Pulse 74  - Temp 36.3 ??C (97.3 ??F) (Temporal)  - Wt 97.5 kg (215 lb)  - SpO2 96%  - BMI 28.37 kg/m??   GEN: NAD, sitting in chair looking straight ahead  EYES: EOMI, sclera anicteric  ENT: Trachea midline, MMM.  CV: RRR, no murmurs appreciated  PULM: Distant breath sounds but some faint wheezes with forced expiration.  Caused a cough  EXT: No edema, no clubbing  NEURO: Grossly  Non-focal, moving all extremities normally  PSYCH: A+Ox3, appropriate    LABORATORY and RADIOLOGY DATA:     Pulmonary Function Tests/Interpretation:    Date FEV1  (Pre/Post) FVC  (Pre/Post) FEV1/FVC  (Pre/Post) DLCO   07/31/21  1.97 (49.9%) / 2.01 (51.0%) 4.35 (85.4%) / 4.45 (87.3%) 45% / 45% 22.4 (75%)   03/02/2024 1.72 (46%) 4.19 41 20.3 (68%)     Spirometry:  Spirometry shows moderate obstructive impairment. No improvement after inhaled bronchodilator  Flow Volume:  normal forced inspiratory flows.    Lung volume:  Lung volumes are consistent with hyperinflation and Lung volumes are consistent with gas trapping. (RV 232%, TLC 132%, FRC 186%)    DLCO:  DLCO is mildly reduced      : 05/12/22  Walked 460m, O2 saturation nadir was 97% on RA      Pertinent Laboratory Data:  10/16/21: normal folate, iron panel, ESR. Mildly low TSH (0.385). Nl hgb, wbc, plts     Pertinent Imaging Data:  Screening Chest CCT 07/17/21: mild emphysema and bronchial wall thickening, some mucous plugging and micronodules, Lung RADS 1--repeat in 12 months    Chest CT 10/22/22: Scattered micronodules, unchanged. No suspicious pulmonary nodules. .  Repeat in 12 months    Chest CT 10/28/2023: Change triangular 7 mm juxtapleural nodule in the posterior apical right upper lobe and additional smaller micronodules, favor intrapulmonary lymph nodes. No suspicious pulmonary nodules. Continue annual lung cancer screening with low-dose CT chest in 12 months.     V/Q/Spect: normal perfusion, changes c/w COPD and emphysema     Echo 08/25/21: normal EF, RV not well visualized but probably normal; difficult study  Echo 11/05/2023: left ventricular systolic function is normal, LVEF is visually estimated at > 55%. The right ventricle is normal in size, with normal systolic function.     LHC 03/14/24: 1 vessel CAD in distal RCA d/p PTCA, elevated LVEDP      ASSESSMENT and PLAN     Chase Mendoza is a 58 y.o. male with COPD, CAD s/p PCI to RCA in 2018, HTN, HLD, GERD, renal stones, persistent tobacco use (cigarettes) who is here for COPD management.    #Severe COPD, Gold IIIB  -  Breztri  160mcg 2 puffs BID with Spacer. Trelegy leaves taste in mouth with concern of inadequate amount of medication delivered to lungs  -Continue duonebs/albuterol  inhaler as rescue  -Continued discussion about pulmonary rehab should patient be interested   - adding Ohtuvayre     #Current tobacco Smoker  -LDCT scan due May 2026 but abnormal CT in October that needs f/u in Jan  Continued discussion about smoking cessation      Assessment & Plan  Chronic obstructive pulmonary disease GOLD III B  Symptoms improving with antibiotics and steroids this week (doxycycline). Trelegy preferred over Breztri  due to better symptom control and fewer side effects. Dry mouth likely exacerbated by Trelegy. Ohtuvayre  considered for exacerbation and dyspnea management. Nucala discussed but not preferred due to existing injection regimen/already on Dupixent  for his skin  - Continue Trelegy once daily.  - Initiated Ohtuvayre  nebulized medication twice daily.  - Continue DuoNebs as needed.  - Signed consent form for Ohtuvayre  insurance coverage to be obtained--he needed to leave so will mail it to him  - Scheduled repeat CT scan in January to f/u abnormal CT from October.    Nicotine  dependence, cigarettes  Long-standing nicotine  dependence with current smoking of 1.5 packs per day. Expresses desire to quit but faces challenges due  to stress and lack of effective cessation aids. Previous attempts with gum unsuccessful due to dental issues. Vaping not tolerated due to choking. Smoking cessation is a prerequisite for consideration of valve replacement surgery.  - Encouraged smoking cessation efforts.  - Discussed potential for valve replacement surgery contingent on smoking cessation.  - con't to work on smoking cessation    Need for RSV immunization  Due for RSV immunization. Expresses concern about RSV more than COVID-2023.  - Administered RSV vaccine.    Gave covid vaccine today      Return in about 6 months (around 11/23/2024).    Patient seen, examined, and discussed with Dr. Charlott.    Immunization History   Administered Date(s) Administered    COVID-19 VAC,BIVALENT,MODERNA(BLUE CAP) 04/21/2021    COVID-19 VACCINE,MRNA(MODERNA)(PF) 11/09/2019, 12/13/2019, 07/25/2020    Covid-19 Vac, (91yr+) (Comirnaty) Mrna Pfizer  05/25/2024    Covid-19 Vac, (53yr+) (Spikevax) Monovalent Moderna 04/06/2022    Hep A / Hep B 07/02/2022, 08/13/2022    INFLUENZA VACCINE IIV3(IM)(PF)6 MOS UP 03/02/2024    Influenza Vaccine Quad(IM)6 MO-Adult(PF) 05/17/2015, 04/03/2016, 03/18/2017, 03/29/2019, 03/17/2021, 03/12/2022    Influenza Virus Vaccine, unspecified formulation 01/18/2017    PNEUMOCOCCAL POLYSACCHARIDE 23-VALENT 05/17/2015, 01/10/2020    RSV VACCINE, BIVALENT(PF)(ABRYSVO) 05/25/2024    TdaP 05/17/2015

## 2024-05-25 ENCOUNTER — Ambulatory Visit
Admit: 2024-05-25 | Discharge: 2024-05-26 | Payer: Medicare (Managed Care) | Attending: Pulmonary Disease | Primary: Pulmonary Disease

## 2024-05-25 DIAGNOSIS — Z2911 Encounter for prophylactic immunotherapy for respiratory syncytial virus (RSV): Principal | ICD-10-CM

## 2024-05-25 DIAGNOSIS — J449 Chronic obstructive pulmonary disease, unspecified: Principal | ICD-10-CM

## 2024-05-25 DIAGNOSIS — F17210 Nicotine dependence, cigarettes, uncomplicated: Principal | ICD-10-CM

## 2024-05-25 MED ORDER — TRELEGY ELLIPTA 200 MCG-62.5 MCG-25 MCG POWDER FOR INHALATION
Freq: Every day | RESPIRATORY_TRACT | 3 refills | 120.00000 days | Status: CP
Start: 2024-05-25 — End: ?

## 2024-05-25 NOTE — Progress Notes (Signed)
 RSV and COVID vaccine given per order.  Patient tolerated well.     Application for Ohtuvayre  mailed to patient's home for signature, stamped envelope included with mailing.

## 2024-05-25 NOTE — Patient Instructions (Addendum)
 Ok--the new year is your time to quit smoking!  You can do it.  Remember you can't get the valves unles you stop smoking  I will schedule you for your follow up CT in Jan and see you after that  If you have trouble getting the Trelegy in, let me know  We will try you on this new medication, Ohtuvayre .  It is nebulized twice a day      Thank you for visiting the [facility]. You may receive a survey regarding your visit - we are very interested in your responses so we can continue to improve your clinic experience.     For medical emergencies: please call 911     For urgent medical issues after hours: [phone], [facility] Operator (ask for the pulmonary fellow on call)     For the COPD nurse, Inocente Crosser, call [phone]    For pulmonary clinic appointments/Nurse line: please call [phone] or send a message through [facility]    For ANCA Vasculitis/Nephrology clinic questions and/or appointments: [phone]      Are you interested in being part of research on lung disease?   If so, visit [email or web address] or scan the QR code below:

## 2024-06-16 ENCOUNTER — Inpatient Hospital Stay: Admit: 2024-06-16 | Discharge: 2024-06-16 | Payer: Medicare (Managed Care)

## 2024-06-19 NOTE — Progress Notes (Addendum)
 [facility] Refill Coordination Note    Specialty Medication(s) to be Shipped:   CF/Pulmonary/Asthma: Dupixent     Other medication(s) to be shipped: Repatha     Specialty Medications not needed at this time: N/A     RMANI KELLOGG, DOB: [birth year]  Phone: [phone] (home)       All above HIPAA information was verified with patient.     Was a nurse, learning disability used for this call? No    Completed refill call assessment today to schedule patient's medication shipment from the [facility]  [phone]).  All relevant notes have been reviewed.     Specialty medication(s) and dose(s) confirmed: Regimen is correct and unchanged.   Changes to medications: Heaton reports no changes at this time.  Changes to insurance: No  New side effects reported not previously addressed with a pharmacist or physician: None reported  Questions for the pharmacist: No    Confirmed patient received a Conservation Officer, Historic Buildings and a Surveyor, Mining with first shipment. The patient will receive a drug information handout for each medication shipped and additional FDA Medication Guides as required.       DISEASE/MEDICATION-SPECIFIC INFORMATION        N/A    SPECIALTY MEDICATION ADHERENCE     Medication Adherence    Patient reported X missed doses in the last month: 0  Specialty Medication: DUPIXENT  PEN 300 mg/2 mL pen injector  Patient is on additional specialty medications: No  Patient is on more than two specialty medications: No  Any gaps in refill history greater than 2 weeks in the last 3 months: no  Demonstrates understanding of importance of adherence: yes  Informant: patient          Were doses missed due to medication being on hold? No    Dupixent  300/2 mg/ml: 0 doses of medicine on hand   Repatha  140 mg/ml: 0 doses of medicine on hand     Specialty medication is an injection or given on a cycle: Yes, Next injection is scheduled for 1/18 (patient reports getting a little off with holidays).    REFERRAL TO PHARMACIST     Referral to the pharmacist: Not needed      [facility]     Shipping address confirmed in Epic.     Cost and Payment: Patient has a copay of $25.30. They are aware and have authorized the pharmacy to charge the credit card on file.    Delivery Scheduled: Yes, Expected medication delivery date: 06/21/24.     Medication will be delivered via Next Day Courier to the prescription address in Epic WAM.    Lauraine Lewis, PharmD   [facility]  Specialty Pharmacist

## 2024-06-20 MED FILL — DUPIXENT 300 MG/2 ML SUBCUTANEOUS PEN INJECTOR: SUBCUTANEOUS | 28 days supply | Qty: 4 | Fill #11

## 2024-06-20 MED FILL — REPATHA SURECLICK 140 MG/ML SUBCUTANEOUS PEN INJECTOR: SUBCUTANEOUS | 28 days supply | Qty: 2 | Fill #6

## 2024-07-12 DIAGNOSIS — Z720 Tobacco use: Principal | ICD-10-CM

## 2024-07-12 DIAGNOSIS — R911 Solitary pulmonary nodule: Secondary | ICD-10-CM

## 2024-07-12 DIAGNOSIS — J439 Emphysema, unspecified: Secondary | ICD-10-CM
